# Patient Record
Sex: Male | Born: 1955 | Race: White | Hispanic: No
Health system: Midwestern US, Community
[De-identification: ages and names within clinical notes are randomized; demographics above are authoritative.]

## PROBLEM LIST (undated history)

## (undated) DIAGNOSIS — G4730 Sleep apnea, unspecified: Secondary | ICD-10-CM

## (undated) DIAGNOSIS — Z720 Tobacco use: Secondary | ICD-10-CM

## (undated) DIAGNOSIS — K219 Gastro-esophageal reflux disease without esophagitis: Secondary | ICD-10-CM

## (undated) DIAGNOSIS — Z923 Personal history of irradiation: Secondary | ICD-10-CM

## (undated) DIAGNOSIS — C61 Malignant neoplasm of prostate: Secondary | ICD-10-CM

## (undated) DIAGNOSIS — E079 Disorder of thyroid, unspecified: Secondary | ICD-10-CM

## (undated) DIAGNOSIS — I10 Essential (primary) hypertension: Secondary | ICD-10-CM

## (undated) DIAGNOSIS — M1990 Unspecified osteoarthritis, unspecified site: Secondary | ICD-10-CM

## (undated) DIAGNOSIS — E119 Type 2 diabetes mellitus without complications: Secondary | ICD-10-CM

## (undated) DIAGNOSIS — K8689 Other specified diseases of pancreas: Secondary | ICD-10-CM

## (undated) DIAGNOSIS — D136 Benign neoplasm of pancreas: Secondary | ICD-10-CM

## (undated) DIAGNOSIS — Z8546 Personal history of malignant neoplasm of prostate: Secondary | ICD-10-CM

## (undated) DIAGNOSIS — K862 Cyst of pancreas: Secondary | ICD-10-CM

## (undated) DIAGNOSIS — N201 Calculus of ureter: Secondary | ICD-10-CM

## (undated) DIAGNOSIS — N132 Hydronephrosis with renal and ureteral calculous obstruction: Secondary | ICD-10-CM

## (undated) DIAGNOSIS — C259 Malignant neoplasm of pancreas, unspecified: Principal | ICD-10-CM

## (undated) DIAGNOSIS — R935 Abnormal findings on diagnostic imaging of other abdominal regions, including retroperitoneum: Secondary | ICD-10-CM

## (undated) DIAGNOSIS — Z1211 Encounter for screening for malignant neoplasm of colon: Secondary | ICD-10-CM

## (undated) HISTORY — PX: CHOLECYSTECTOMY: SHX55

## (undated) HISTORY — PX: TONSILLECTOMY: SUR1361

## (undated) HISTORY — PX: KNEE ARTHROSCOPY: SUR90

## (undated) HISTORY — PX: HERNIA REPAIR: SHX51

---

## 1995-07-15 HISTORY — PX: SHOULDER SURGERY: SHX246

## 2016-09-08 DIAGNOSIS — G4733 Obstructive sleep apnea (adult) (pediatric): Secondary | ICD-10-CM | POA: Insufficient documentation

## 2016-09-08 DIAGNOSIS — E039 Hypothyroidism, unspecified: Secondary | ICD-10-CM | POA: Insufficient documentation

## 2016-09-08 DIAGNOSIS — Z6841 Body Mass Index (BMI) 40.0 and over, adult: Secondary | ICD-10-CM | POA: Insufficient documentation

## 2016-09-08 DIAGNOSIS — E119 Type 2 diabetes mellitus without complications: Secondary | ICD-10-CM | POA: Insufficient documentation

## 2016-09-08 DIAGNOSIS — Z720 Tobacco use: Secondary | ICD-10-CM | POA: Insufficient documentation

## 2016-09-08 DIAGNOSIS — I10 Essential (primary) hypertension: Secondary | ICD-10-CM | POA: Insufficient documentation

## 2016-12-19 ENCOUNTER — Encounter: Payer: Self-pay | Admitting: Radiation Oncology

## 2016-12-23 ENCOUNTER — Encounter: Payer: Self-pay | Admitting: Radiation Oncology

## 2016-12-24 ENCOUNTER — Ambulatory Visit
Admission: RE | Admit: 2016-12-24 | Discharge: 2016-12-24 | Disposition: A | Discharge status: Home / Self Care | Payer: No Typology Code available for payment source | Source: Ambulatory Visit | Attending: Radiation Oncology | Admitting: Radiation Oncology

## 2016-12-24 ENCOUNTER — Ambulatory Visit: Payer: No Typology Code available for payment source

## 2016-12-24 DIAGNOSIS — C61 Malignant neoplasm of prostate: Secondary | ICD-10-CM | POA: Diagnosis present

## 2016-12-30 ENCOUNTER — Encounter: Payer: Self-pay | Admitting: Radiation Oncology

## 2016-12-30 NOTE — Progress Notes (Addendum)
GU Location of Tumor / Histology: Prostatic adenocarcinoma  If Prostate Cancer, Gleason Score is (4 + 5) on 08/14/2016 and PSA is (48.30) on 07/29/2016  Evan Newton presented to Dr. John Giovanni at [facility] in [place] on January 16th signs/symptoms of elevated PSA from approximately 2-3 years ago when he lived in [place], [place].  Patient complains that back in 2015 he also had lower urinary tract symptoms.  Biopsies of prostate on 08/14/2016 revealed:        Past/Anticipated interventions by urology, if any: no  Past/Anticipated interventions by medical oncology, if any: no  Weight changes, if any: no  Bowel/Bladder complaints, if any: Weak stream, nocturia q 2 hours, urinary frequency 12 times a day, urinary urgency, occasional dysuria, nocturia X 4-5. Denies urinary leakage. Reports dysuria is improving (3 weeks s/p exrt).    Nausea/Vomiting, if any: yes each morning without vomiting. Reports nausea was worse while receiving radiation therapy.  Pain issues, if any:  Chronic Joint Pain; intermittently  SAFETY ISSUES:  Prior radiation? Yes [facility]  Pacemaker/ICD? No  Possible current pregnancy? No  Is the patient on methotrexate? No  Current Complaints / other details:  60 year history of Smoking.  Takes Flomax and Lupron (next injection due 01/06/2017).  Reports considerable fatigue and further decline per Dr. Lianne Moris notes at [facility].   Patient received XRT at [facility] and per records declined further assistance from [facility] with brachytherapy and decided to seek treatment from another institution for brachytherapy.  Resides in [place].

## 2016-12-31 ENCOUNTER — Encounter: Payer: Self-pay | Admitting: Radiation Oncology

## 2016-12-31 ENCOUNTER — Ambulatory Visit
Admission: RE | Admit: 2016-12-31 | Discharge: 2016-12-31 | Disposition: A | Discharge status: Home / Self Care | Payer: No Typology Code available for payment source | Source: Ambulatory Visit | Attending: Radiation Oncology | Admitting: Radiation Oncology

## 2016-12-31 VITALS — BP 133/90 | HR 84 | Resp 16 | Ht 67.0 in | Wt 316.4 lb

## 2016-12-31 DIAGNOSIS — C61 Malignant neoplasm of prostate: Secondary | ICD-10-CM

## 2016-12-31 HISTORY — DX: Essential (primary) hypertension: I10

## 2016-12-31 HISTORY — DX: Gastro-esophageal reflux disease without esophagitis: K21.9

## 2016-12-31 HISTORY — DX: Personal history of irradiation: Z92.3

## 2016-12-31 HISTORY — DX: Sleep apnea, unspecified: G47.30

## 2016-12-31 HISTORY — DX: Tobacco use: Z72.0

## 2016-12-31 HISTORY — DX: Malignant neoplasm of prostate: C61

## 2016-12-31 HISTORY — DX: Type 2 diabetes mellitus without complications: E11.9

## 2016-12-31 HISTORY — DX: Disorder of thyroid, unspecified: E07.9

## 2016-12-31 NOTE — Progress Notes (Signed)
See progress note under physician encounter. 

## 2016-12-31 NOTE — Progress Notes (Signed)
Radiation Oncology         [phone]) [phone] ________________________________  Initial outpatient Consultation  Name: Evan Newton MRN: [id number]  Date: 12/31/2016  DOB: 1956-07-07  EH:[id number], Evan Mule, MD  Crowder, Evan Newton,*   REFERRING PHYSICIAN: Stoney Newton,*  DIAGNOSIS: 61 y.o. 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 Malignant neoplasm of prostate ([place]) C61      HISTORY OF PRESENT ILLNESS: Evan Newton is a 61 y.o. male with a diagnosis of prostate cancer. He was noted to have an elevated PSA of 38.54 by his primary care physician in 06/2016.  Accordingly, he was referred for evaluation in urology by Dr. John Giovanni on 07/29/2016, digital rectal exam at that time revealed a right sided nodule.  Repeat PSA on 07/29/16 was further elevated at 48.30. The patient proceeded to transrectal ultrasound with 12 biopsies of the prostate on 08/14/2016.  The prostate volume measured 62 cc.  Out of 12 core biopsies, 3 were positive.  The maximum Gleason score was 4+5, and this was seen in the right apex, right mid, and right base.   CT A/P for disease staging was obtained on 09/05/16 and revealed numerous subcentimeter bilateral pulmonary nodules felt to be indeterminate as well as multiple subcentimeter sclerotic osseous lesions but no evidence of obvious metastatic disease. He underwent a bone scan on 09/05/2016 which showed a focus of uptake in the posterior skull concerning for sclerotic metastasis.  This was further evaluated with a CT head on 09/12/2016 which was negative for metastatic lesions.  An MRI of the prostate was performed on 09/27/2016 and showed evidence of extracapsular extension. The tumor was felt to contact, but not definitely invading, the base of the right seminal vesicle. There was an indeterminate foci in the right femoral head/neck which was questionable for metastatic disease.  He  began hormone depravation therapy with Lupron and Casodex on October 06, 2016. His next Lupron injection is due 01/08/17.  In addition to ADT, the patient elected to proceed with 5 weeks of EBRT followed by a brachytherapy boost for treatment of his high risk prostate cancer.  He initiated treatment at [facility] with Dr. Bridgett Newton in radiation oncology. He received a total of 45gy at 1.8gy/fraction for 25 fractions which he completed on 12/19/16. He was initially planned to undergo brachytherapy on 01/12/2017 but unfortunately, due to a negative experience throughout EBRT, he elected to cancel the procedure and seek care locally.  He presents to the clinic today to discuss prostate brachytherapy as a boost, having already completed 5 weeks of EBRT. He is interested in transferring all of his care here to [facility] and would therefore also like a referral to a local urologist. He is accompanied today by his sister.   PREVIOUS RADIATION THERAPY: Yes  Prior RT to the prostate with Dr. Bridgett Newton at [facility]:  45gy at 1.8gy/fraction for 25 fractions.  Started Nov 14, 2016 and completed December 19, 2016.  PAST MEDICAL HISTORY:  Past Medical History:  Diagnosis Date  . Diabetes ([place])    Type 2  . GERD (gastroesophageal reflux disease)   . History of external beam radiation therapy   . Hypertension   . Prostate cancer ([place])   . Sleep apnea    CPAP at night  . Thyroid disease   . Tobacco abuse       PAST SURGICAL HISTORY: Past Surgical History:  Procedure Laterality Date  .  CHOLECYSTECTOMY    . HERNIA REPAIR    . KNEE ARTHROPLASTY    . TONSILLECTOMY      FAMILY HISTORY: No family history on file.  SOCIAL HISTORY:  Social History   Social History  . Marital status: Divorced    Spouse name: N/A  . Number of children: N/A  . Years of education: N/A   Occupational History  . Not on file.   Social History Main Topics  . Smoking status: Never Smoker  . Smokeless tobacco: Never Used  . Alcohol use No  . Drug  use: No  . Sexual activity: Not Currently   Other Topics Concern  . Not on file   Social History Narrative  . No narrative on file    ALLERGIES: Patient has no known allergies.  MEDICATIONS:  Current Outpatient Prescriptions  Medication Sig Dispense Refill  . amLODipine (NORVASC) 5 MG tablet Take 5 mg by mouth daily.    Marland Kitchen atorvastatin (LIPITOR) 20 MG tablet Take 20 mg by mouth daily.    . bicalutamide (CASODEX) 50 MG tablet Take 50 mg by mouth daily.    . fluticasone (FLONASE) 50 MCG/ACT nasal spray Place into both nostrils daily.    Marland Kitchen glipiZIDE (GLUCOTROL) 10 MG tablet Take 10 mg by mouth daily before breakfast.    . levothyroxine (SYNTHROID, LEVOTHROID) 200 MCG tablet Take 200 mcg by mouth daily before breakfast.    . loratadine (CLARITIN) 10 MG tablet Take 10 mg by mouth daily.    . metFORMIN (GLUCOPHAGE) 500 MG tablet Take 1,000 mg by mouth 2 (two) times daily with a meal.    . tamsulosin (FLOMAX) 0.4 MG CAPS capsule Take 0.4 mg by mouth.    . valsartan (DIOVAN) 160 MG tablet Take 160 mg by mouth daily.    Marland Kitchen aspirin EC 81 MG tablet Take 81 mg by mouth daily.     No current facility-administered medications for this encounter.     REVIEW OF SYSTEMS:  On review of systems, the patient reports that he is doing well overall. He denies weight change. He reports fatigue, which is somewhat improved recently, and also has hot flashes, which he attributes to his hormone depravation therapy. He reports nausea and vomiting. He denies any new musculoskeletal or joint aches or pains, but reports chronic joint pain, which is intermittent in nature. The patient denies SOB, asthma or COPD.  He has sleep apnea and uses a CPAP at night. His IPSS was 26, indicating severe urinary symptoms.  These symptoms include weak stream, nocturia x 4-5, urinary frequency 12x per day, urinary urgency, and occasional dysuria. He denies gross hematuria, flank pain, fever or chills. He has mild-moderate ED. A  complete review of systems is obtained and is otherwise negative.    PHYSICAL EXAM:  Wt Readings from Last 3 Encounters:  12/31/16 (!) 316 lb 6.4 oz (143.5 kg)   Temp Readings from Last 3 Encounters:  No data found for Temp   BP Readings from Last 3 Encounters:  12/31/16 133/90   Pulse Readings from Last 3 Encounters:  12/31/16 84   Pain Assessment Pain Score:  (see progress note)/10  In general this is a well appearing, caucasian male in no acute distress. He is alert and oriented x4 and appropriate throughout the examination. HEENT reveals that the patient is normocephalic, atraumatic. Cardiovascular exam reveals a regular rate and rhythm, no clicks rubs or murmurs are auscultated. Chest is clear to auscultation bilaterally. Lymphatic assessment is performed and  does not reveal any adenopathy in the cervical, supraclavicular, axillary, or inguinal chains. Abdomen is relatively large, non-distended and non-tender to palpation with active bowel sounds in all quadrants. There is 2+ pitting edema in bilateral LEs but no deep calf pain, cyanosis or clubbing.   KPS = 90  LABORATORY DATA:  No results found for: WBC, HGB, HCT, MCV, PLT No results found for: NA, K, CL, CO2 No results found for: ALT, AST, GGT, ALKPHOS, BILITOT   RADIOGRAPHY: No results found.    IMPRESSION/PLAN: 1. 61 y.o. 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 Today, we and I reviewed the findings and workup thus far.  We discussed the natural history of prostate cancer.  We reviewed the the implications of T-stage, Gleason's Score, and PSA on decision-making and outcomes in prostate cancer.  We discussed radiation treatment in the management of high risk prostate cancer with regard to the logistics and delivery of prostate brachytherapy. He is currently on ADT which was started 10/06/16.  We would recommend 2 years of ADT in addition to his radiotherapy for  treatment of his high risk prostate cancer. The patient expressed interest in prostate brachytherapy as a boost, having previously completed 5 weeks of EBRT with Dr. Bridgett Newton at [facility].  The patient would like to proceed with prostate brachytherapy. He will be referred to [facility] for consultation to establish care with a local urologist for continuation of ADT and brachytherapy scheduling in the near future.  We enjoyed meeting with him today, and look forward to participating in the care of this very nice gentleman.    Nicholos Johns, PA-C    Tyler Pita, MD  [facility] Direct Dial: [phone]  Fax: [phone] [email or web address]  Skype  [email or web address]  This document serves as a record of services personally performed by Tyler Pita, MD and Freeman Caldron, PA-C. It was created on their behalf by Maryla Morrow, a trained medical scribe. The creation of this record is based on the scribe's personal observations and the provider's statements to them. This document has been checked and approved by the attending provider.

## 2017-01-01 ENCOUNTER — Telehealth: Payer: Self-pay | Admitting: Medical Oncology

## 2017-01-01 NOTE — Telephone Encounter (Signed)
Left a message to introduce myself as prostate nurse navigator and my role to Evan Newton. I was unable to meet him when he consulted with Dr. Tammi Klippel 12/31/16. He currently just completed 5 weeks of radiation at [facility] and is for brachytherapy boost. Dr. Johny Shears office is going to get him scheduled with [facility] to assist. I asked him to call me with questions or concerns.

## 2017-01-02 ENCOUNTER — Encounter: Payer: Self-pay | Admitting: *Deleted

## 2017-01-02 NOTE — Progress Notes (Signed)
[place] Psychosocial Distress Screening Clinical Social Work  Clinical Social Work was referred by distress screening protocol.  The patient scored a 9 on the Psychosocial Distress Thermometer which indicates severe distress. Clinical Social Worker reviewed chart and contacted pt via phone at home to assess for distress and other psychosocial needs. CSW introduced self, explained role of CSW/Pt and Family Support Team, support groups and other resources to assist. Pt reports he was on the way to a funeral and could not talk at the present time. Pt encouraged to reach out as needed for CSW assistance and support.    ONCBCN DISTRESS SCREENING 12/31/2016  Screening Type Initial Screening  Distress experienced in past week (1-10) 9  Practical problem type Insurance;Work/school;Food  Family Problem type Children  Emotional problem type Nervousness/Anxiety;Adjusting to illness;Feeling hopeless  Information Concerns Type Lack of info about diagnosis;Lack of info about treatment;Lack of info about maintaining fitness  Physical Problem type Pain;Nausea/vomiting;Sleep/insomnia;Getting around;Constipation/diarrhea;Changes in urination;Tingling hands/feet;Skin dry/itchy  Physician notified of physical symptoms Yes  Referral to clinical psychology No  Referral to clinical social work Yes  Referral to dietition No  Referral to financial advocate No  Referral to support programs No  Referral to palliative care No    Clinical Social Worker follow up needed: No.  If yes, follow up plan:  Loren Racer, Marlinda Mike, OSW-C Clinical Social Worker [place]  [facility] Phone: [phone] Fax: [phone]

## 2017-01-05 ENCOUNTER — Telehealth: Payer: Self-pay | Admitting: Medical Oncology

## 2017-01-05 ENCOUNTER — Telehealth: Payer: Self-pay | Admitting: *Deleted

## 2017-01-05 NOTE — Telephone Encounter (Signed)
Called patient to inform of consultation on 02-04-17- arrival time - 10:15 am with Dr. Hollice Espy @ [facility] [address] @ the [place] office to the left once in the building, ph. No. - [phone], lvm for a return call

## 2017-01-06 ENCOUNTER — Telehealth: Payer: Self-pay | Admitting: Medical Oncology

## 2017-01-06 NOTE — Telephone Encounter (Signed)
Spoke with Evan Newton to confirm he received voicemail left by Enid Derry for appointment with Dr. Norina Buzzard in [place] on 02/04/17. He states that he received this message but  Dr. Cherrie Gauze office was able to schedule him 01/07/17 but he is not sure of time. I will confirm time and call him back. He voiced understanding. I spoke with Dr. Cherrie Gauze office and his arrival is 2:15 pm for 2:30 pm appointment. He voiced understanding.

## 2017-01-06 NOTE — Telephone Encounter (Signed)
Evan Newton called to inquire about appointment with urologist for seed boost. He was consulted with Dr. Tammi Klippel 12/31/16 and has not heard back. I informed him that I will follow up and give him a call back. He voiced understanding.

## 2017-01-07 ENCOUNTER — Ambulatory Visit (INDEPENDENT_AMBULATORY_CARE_PROVIDER_SITE_OTHER): Payer: No Typology Code available for payment source | Admitting: Urology

## 2017-01-07 ENCOUNTER — Encounter: Payer: Self-pay | Admitting: Urology

## 2017-01-07 VITALS — BP 126/70 | HR 85 | Ht 67.0 in | Wt 315.0 lb

## 2017-01-07 DIAGNOSIS — R350 Frequency of micturition: Secondary | ICD-10-CM

## 2017-01-07 DIAGNOSIS — C61 Malignant neoplasm of prostate: Secondary | ICD-10-CM

## 2017-01-07 MED ORDER — LEUPROLIDE ACETATE (3 MONTH) 22.5 MG IM KIT
22.5000 mg | PACK | Freq: Once | INTRAMUSCULAR | Status: AC
  Administered 2017-01-07: 22.5 mg via INTRAMUSCULAR

## 2017-01-07 NOTE — Progress Notes (Signed)
01/07/2017 5:16 PM   Evan Newton [birth year] [id number]  Referring provider: Freeman Caldron, PA-C [place], [place] [address]  Chief Complaint  Patient presents with  . Prostate Cancer    New Patient    HPI: 61 year old male with high risk prostate cancer, stage TIIa who presents to establish care of [facility]  He was initially diagnosed by Dr. Bernardo Heater on 07/29/2016 With an abnormal rectal exam, PSA of 38.54.  Prostate biopsy revealed a volume of 62 cc with 3 of 12 cores positive with a maximum Gleason score 4+5 at the right apex, right mid-gland, right base.   Staging including CT abdomen and pelvis on 2/18 revealed numerous subcentimeter pulmonary nodules which are indeterminate. There is also several subsequent centimeter osseous sclerotic lesions which were negative on bone scan. He did have a posterior skull focus concerning for sclerotic metastasis but follow-up CT head was negative. Additional imaging includes an MRI of the prostate on 09/27/2016 which showed evidence of extracapsular extension, possible right seminal vesicle invasion and a partial lesion involving the right femoral head/neck not seen on other studies.  He elected external beam radiation with brachytherapy boost as well as total androgen therapy including Lupron and Casodex started 09/2016.  He tolerated 5 weeks of external beam radiation (45 gray completed 12/19/16), however, due to general dissatisfaction with his care, he elected to transfer his care to [facility] which is more convenient for him.  He does report that he had fairly significant rectal symptoms including loose stool during radiation which is improved dramatically. He does have some baseline urinary symptoms including nocturia 2-3 which is slightly worse following radiation but slowly improving. His leg is able to empty his bladder. No dysuria or gross hematuria. No UTIs.  He is having hot flashes from his androgen  deprivation therapy, otherwise has no complaints. He ran out of Casodex a few days ago and just refilled this prescription.   PMH: Past Medical History:  Diagnosis Date  . Diabetes ([place])    Type 2  . GERD (gastroesophageal reflux disease)   . History of external beam radiation therapy   . Hypertension   . Prostate cancer ([place])   . Sleep apnea    CPAP at night  . Thyroid disease   . Tobacco abuse     Surgical History: Past Surgical History:  Procedure Laterality Date  . CHOLECYSTECTOMY    . HERNIA REPAIR    . KNEE ARTHROPLASTY    . TONSILLECTOMY      Home Medications:  Allergies as of 01/07/2017   No Known Allergies     Medication List       Accurate as of 01/07/17  5:16 PM. Always use your most recent med list.          amLODipine 5 MG tablet Commonly known as:  NORVASC Take 5 mg by mouth daily.   aspirin EC 81 MG tablet Take 81 mg by mouth daily.   atorvastatin 20 MG tablet Commonly known as:  LIPITOR Take 20 mg by mouth daily.   bicalutamide 50 MG tablet Commonly known as:  CASODEX Take 50 mg by mouth daily.   cetirizine 10 MG tablet Commonly known as:  ZYRTEC Take 10 mg by mouth daily.   fluticasone 50 MCG/ACT nasal spray Commonly known as:  FLONASE Place into both nostrils daily.   glipiZIDE 10 MG tablet Commonly known as:  GLUCOTROL Take 10 mg by mouth daily before breakfast.   levothyroxine 200  MCG tablet Commonly known as:  SYNTHROID, LEVOTHROID Take 200 mcg by mouth daily before breakfast.   loratadine 10 MG tablet Commonly known as:  CLARITIN Take 10 mg by mouth daily.   metFORMIN 500 MG tablet Commonly known as:  GLUCOPHAGE Take 1,000 mg by mouth 2 (two) times daily with a meal.   tamsulosin 0.4 MG Caps capsule Commonly known as:  FLOMAX Take 0.4 mg by mouth.   valsartan 160 MG tablet Commonly known as:  DIOVAN Take 160 mg by mouth daily.       Allergies: No Known Allergies  Family History: Family History  Problem  Relation Age of Onset  . Kidney cancer Neg Hx   . Prostate cancer Neg Hx   . Bladder Cancer Neg Hx     Social History:  reports that he has never smoked. He has never used smokeless tobacco. He reports that he does not drink alcohol or use drugs.  ROS: UROLOGY Frequent Urination?: Yes Hard to postpone urination?: Yes Burning/pain with urination?: Yes Get up at night to urinate?: Yes Leakage of urine?: No Urine stream starts and stops?: Yes Trouble starting stream?: No Do you have to strain to urinate?: No Blood in urine?: No Urinary tract infection?: No Sexually transmitted disease?: No Injury to kidneys or bladder?: No Painful intercourse?: No Weak stream?: Yes Erection problems?: Yes Penile pain?: No  Gastrointestinal Nausea?: Yes Vomiting?: No Indigestion/heartburn?: No Diarrhea?: Yes Constipation?: No  Constitutional Fever: Yes Night sweats?: Yes Weight loss?: No Fatigue?: Yes  Skin Skin rash/lesions?: No Itching?: No  Eyes Blurred vision?: No Double vision?: No  Ears/Nose/Throat Sore throat?: Yes Sinus problems?: Yes  Hematologic/Lymphatic Swollen glands?: No Easy bruising?: No  Cardiovascular Leg swelling?: No Chest pain?: No  Respiratory Cough?: Yes Shortness of breath?: No  Endocrine Excessive thirst?: No  Musculoskeletal Back pain?: No Joint pain?: Yes  Neurological Headaches?: Yes Dizziness?: Yes  Psychologic Depression?: No Anxiety?: No  Physical Exam: BP 126/70   Pulse 85   Ht 5\' 7"  (1.702 m)   Wt (!) 315 lb (142.9 kg)   BMI 49.34 kg/m   Constitutional:  Alert and oriented, No acute distress. HEENT: [place] AT, moist mucus membranes.  Trachea midline, no masses. Cardiovascular: No clubbing, cyanosis, or edema. Respiratory: Normal respiratory effort, no increased work of breathing. GI: Abdomen is soft, nontender, nondistended, no abdominal masses.  Obese. GU: No CVA tenderness.  Skin: No rashes, bruises or suspicious  lesions. Neurologic: Grossly intact, no focal deficits, moving all 4 extremities. Psychiatric: Normal mood and affect.  Laboratory Data: Creatinine 0.8 on 2/18 Hemoglobin A1c 8.9 on 6/18 PSA 48.3 on 1/18  Urinalysis N/a  Pertinent Imaging: Image reports from [facility] including CT scan, bone scan, prostate MRI, head CT were all reviewed, however, unable to visualize images as this was done at outside facility.  Assessment & Plan:    1. Primary malignant neoplasm of prostate with high risk of recurrence due to Gleason score of 8 to 10 and PSA greater than 20 (HCC) High-risk prostate cancer currently on total androgen blockade with Lupron/Casodex Status post external beam radiation with plans for brachytherapy boost, desires to have additional radiotherapy at [facility] Case discussed today by telephone with Dr. Baruch Gouty was agreed to see the patient tomorrow and help facilitate this Lupron injection given today- side effects discussed Given that the patient has presumably Should've This Point in Time, No Overwhelming Evidence to Support Need for Casodex in Addition to ADT, Patient Will Continue His Current Prescription  and will consider whether or not to continue this in the future - leuprolide (LUPRON) injection 22.5 mg; Inject 22.5 mg into the muscle once.  2. Urinary frequency Continue Flomax Symptoms improving, may consider addition of anticholinergic and future if bother increases  Will arrange for brachytherapy boost  Hollice Espy, MD  [facility] [address], [place], [place] [address] [phone]  I spent 45 min with this patient of which greater than 50% was spent in counseling and coordination of care with the patient.

## 2017-01-07 NOTE — Progress Notes (Signed)
Lupron IM Injection   Due to Prostate Cancer patient is present today for a Lupron Injection.  Medication: Lupron 3 month Dose: 22.5 mg  Location: left upper outer buttocks Lot: [id number] Exp: 05/14/2019  Patient tolerated well, no complications were noted  Performed by: Fonnie Jarvis, CMA  Follow up: 3 months

## 2017-01-08 ENCOUNTER — Ambulatory Visit: Payer: No Typology Code available for payment source | Admitting: Radiation Oncology

## 2017-01-08 ENCOUNTER — Encounter: Payer: Self-pay | Admitting: Radiation Oncology

## 2017-01-08 ENCOUNTER — Other Ambulatory Visit: Payer: Self-pay | Admitting: *Deleted

## 2017-01-08 ENCOUNTER — Ambulatory Visit: Payer: No Typology Code available for payment source

## 2017-01-08 ENCOUNTER — Ambulatory Visit
Admission: RE | Admit: 2017-01-08 | Discharge: 2017-01-08 | Disposition: A | Discharge status: Home / Self Care | Payer: No Typology Code available for payment source | Source: Ambulatory Visit | Attending: Radiation Oncology | Admitting: Radiation Oncology

## 2017-01-08 VITALS — BP 113/67 | HR 76 | Temp 97.5°F | Resp 18 | Ht 67.0 in | Wt 313.3 lb

## 2017-01-08 DIAGNOSIS — Z7984 Long term (current) use of oral hypoglycemic drugs: Secondary | ICD-10-CM | POA: Diagnosis not present

## 2017-01-08 DIAGNOSIS — G4730 Sleep apnea, unspecified: Secondary | ICD-10-CM | POA: Insufficient documentation

## 2017-01-08 DIAGNOSIS — K219 Gastro-esophageal reflux disease without esophagitis: Secondary | ICD-10-CM | POA: Insufficient documentation

## 2017-01-08 DIAGNOSIS — J00 Acute nasopharyngitis [common cold]: Secondary | ICD-10-CM | POA: Diagnosis not present

## 2017-01-08 DIAGNOSIS — C61 Malignant neoplasm of prostate: Secondary | ICD-10-CM | POA: Diagnosis not present

## 2017-01-08 DIAGNOSIS — R197 Diarrhea, unspecified: Secondary | ICD-10-CM | POA: Diagnosis not present

## 2017-01-08 DIAGNOSIS — E119 Type 2 diabetes mellitus without complications: Secondary | ICD-10-CM | POA: Diagnosis not present

## 2017-01-08 DIAGNOSIS — Z79899 Other long term (current) drug therapy: Secondary | ICD-10-CM | POA: Diagnosis not present

## 2017-01-08 DIAGNOSIS — E079 Disorder of thyroid, unspecified: Secondary | ICD-10-CM | POA: Diagnosis not present

## 2017-01-08 DIAGNOSIS — I10 Essential (primary) hypertension: Secondary | ICD-10-CM | POA: Insufficient documentation

## 2017-01-08 DIAGNOSIS — F17210 Nicotine dependence, cigarettes, uncomplicated: Secondary | ICD-10-CM | POA: Diagnosis not present

## 2017-01-08 DIAGNOSIS — Z923 Personal history of irradiation: Secondary | ICD-10-CM | POA: Diagnosis not present

## 2017-01-08 MED ORDER — AZITHROMYCIN 250 MG PO TABS: ORAL_TABLET | ORAL | 0 refills | Status: DC

## 2017-01-08 NOTE — Consult Note (Signed)
NEW PATIENT EVALUATION  Name: Evan Newton  MRN: [id number]  Date:   01/08/2017     DOB: 1956-05-15   This 61 y.o. male patient presents to the clinic for initial evaluation of stage IIa adenocarcinoma the prostate presenting with a PSA of 38 and Gleason 9 (4+5) adenocarcinoma the prostate treated with androgen deprivation therapy as well as external beam radiation therapy at [facility] now for I-125 interstitial implant boost.  REFERRING PHYSICIAN: Stoney Bang,*  CHIEF COMPLAINT:  Chief Complaint  Patient presents with  . New Evaluation for Prostate    DIAGNOSIS: The encounter diagnosis was Malignant neoplasm of prostate ([place]).   PREVIOUS INVESTIGATIONS:  Pathology reports reviewed MRI of pelvis reports reviewed Clinical notes reviewed   HPI: Patient is a 61 year old male who presented with an elevated PSA in the 38 range. This prompt transrectal ultrasound-guided biopsy showing a Gleason 9 (4+5) in 3 of 12 cores. Staging CT scans as well as MRI of the pelvis showed no evidence of metastatic disease or pelvic adenopathy. The was concern for extracapsular extension as well as possible right seminal vesicle invasion. Other lesions seen and bone were thought to be non-representative metastatic disease. He was started on androgen deprivation therapy with Lupron and Casodex and underwent 4500 cGy to his pelvis and prostate. He has decided to transfer his care to our facility for completion of his treatments with a I-125 interstitial implant for boost. He is seen today and doing fairly well he has had significant diarrhea over the course of treatment although has not taken any Imodium at this time. He does follow some low residue diet prescription. He recently has a head cold. His prostate measures approximately 57 cc.  PLANNED TREATMENT REGIMEN: I-125 interstitial implant for boost.  PAST MEDICAL HISTORY:  has a past medical history of Diabetes ([place]); GERD (gastroesophageal reflux  disease); History of external beam radiation therapy; Hypertension; Prostate cancer ([place]); Sleep apnea; Thyroid disease; and Tobacco abuse.    PAST SURGICAL HISTORY:  Past Surgical History:  Procedure Laterality Date  . CHOLECYSTECTOMY    . HERNIA REPAIR    . KNEE ARTHROPLASTY    . SHOULDER SURGERY  1997   Rotator cuff  . TONSILLECTOMY      FAMILY HISTORY: family history is not on file.  SOCIAL HISTORY:  reports that he has been smoking.  He has a 40.00 pack-year smoking history. He has never used smokeless tobacco. He reports that he does not drink alcohol or use drugs.  ALLERGIES: Patient has no known allergies.  MEDICATIONS:  Current Outpatient Prescriptions  Medication Sig Dispense Refill  . amLODipine (NORVASC) 5 MG tablet Take 5 mg by mouth daily.    Marland Kitchen aspirin EC 81 MG tablet Take 81 mg by mouth daily.    Marland Kitchen atorvastatin (LIPITOR) 20 MG tablet Take 20 mg by mouth daily.    . bicalutamide (CASODEX) 50 MG tablet Take 50 mg by mouth daily.    . cetirizine (ZYRTEC) 10 MG tablet Take 10 mg by mouth daily.    . fluticasone (FLONASE) 50 MCG/ACT nasal spray Place into both nostrils daily.    Marland Kitchen glipiZIDE (GLUCOTROL) 10 MG tablet Take 10 mg by mouth daily before breakfast.    . levothyroxine (SYNTHROID, LEVOTHROID) 200 MCG tablet Take 200 mcg by mouth daily before breakfast.    . loratadine (CLARITIN) 10 MG tablet Take 10 mg by mouth daily.    . metFORMIN (GLUCOPHAGE) 500 MG tablet Take 1,000 mg by mouth 2 (two)  times daily with a meal.    . tamsulosin (FLOMAX) 0.4 MG CAPS capsule Take 0.4 mg by mouth.    . valsartan (DIOVAN) 160 MG tablet Take 160 mg by mouth daily.    Marland Kitchen azithromycin (ZITHROMAX Z-PAK) 250 MG tablet 5 Day Z-Pack, follow package directions 6 each 0   No current facility-administered medications for this encounter.     ECOG PERFORMANCE STATUS:  0 - Asymptomatic  REVIEW OF SYSTEMS:  Except for the diarrhea which seems to be improving Patient denies any weight loss,  fatigue, weakness, fever, chills or night sweats. Patient denies any loss of vision, blurred vision. Patient denies any ringing  of the ears or hearing loss. No irregular heartbeat. Patient denies heart murmur or history of fainting. Patient denies any chest pain or pain radiating to her upper extremities. Patient denies any shortness of breath, difficulty breathing at night, cough or hemoptysis. Patient denies any swelling in the lower legs. Patient denies any nausea vomiting, vomiting of blood, or coffee ground material in the vomitus. Patient denies any stomach pain. Patient states has had normal bowel movements no significant constipation or diarrhea. Patient denies any dysuria, hematuria or significant nocturia. Patient denies any problems walking, swelling in the joints or loss of balance. Patient denies any skin changes, loss of hair or loss of weight. Patient denies any excessive worrying or anxiety or significant depression. Patient denies any problems with insomnia. Patient denies excessive thirst, polyuria, polydipsia. Patient denies any swollen glands, patient denies easy bruising or easy bleeding. Patient denies any recent infections, allergies or URI. Patient "s visual fields have not changed significantly in recent time.    PHYSICAL EXAM: BP 113/67   Pulse 76   Temp 97.5 F (36.4 C) (Tympanic)   Resp 18   Ht 5\' 7"  (1.702 m)   Wt (!) 313 lb 4.4 oz (142.1 kg)   BMI 49.07 kg/m  Morbidly obese male in NAD. Well-developed well-nourished patient in NAD. HEENT reveals PERLA, EOMI, discs not visualized.  Oral cavity is clear. No oral mucosal lesions are identified. Neck is clear without evidence of cervical or supraclavicular I would like to take this opportunity to thank you for allowing me to participate in the care of your patient.Marland Kitchendenopathy. Lungs are clear to A&P. Cardiac examination is essentially unremarkable with regular rate and rhythm without murmur rub or thrill. Abdomen is benign  with no organomegaly or masses noted. Motor sensory and DTR levels are equal and symmetric in the upper and lower extremities. Cranial nerves II through XII are grossly intact. Proprioception is intact. No peripheral adenopathy or edema is identified. No motor or sensory levels are noted. Crude visual fields are within normal range.  LABORATORY DATA: Pathology reports reviewed    RADIOLOGY RESULTS: CT scans and MRI scan reports reviewed I have requested films from my own review   IMPRESSION: At least stage IIa Gleason 9 adenocarcinoma the prostate  PLAN: At this time like to go ahead with I-125 interstitial implant with the intent to deliver 115 gray is a boost to his prostate for overall completion of his treatment plan. Risks and benefits of implant including the risks of general anesthesia possible increased lower urinary tract symptoms possible diarrhea possible fatigue all were discussed in detail with the patient. Patient also was advised she will be radioactive and radiation safety precautions were reviewed. I have ordered a volume study to be done with urology followed by I-125 interstitial implant. Patient seems to comprehend my treatment plan  well.  I would like to take this opportunity to thank you for allowing me to participate in the care of your patient.Armstead Peaks., MD

## 2017-01-08 NOTE — Progress Notes (Signed)
Patient here as new evaluation regarding prostate cancer.  Patient was followed by [facility] and was unhappy with their service.  He recently saw Dr. Erlene Quan who referred him here.

## 2017-01-12 ENCOUNTER — Telehealth: Payer: Self-pay | Admitting: Radiology

## 2017-01-12 NOTE — Telephone Encounter (Signed)
LMOM. Need to discuss seed implant procedures.

## 2017-01-19 ENCOUNTER — Other Ambulatory Visit: Payer: Self-pay | Admitting: Radiology

## 2017-01-19 DIAGNOSIS — C61 Malignant neoplasm of prostate: Secondary | ICD-10-CM

## 2017-01-19 NOTE — Telephone Encounter (Signed)
Notified pt of prostate volume study scheduled with Dr Erlene Quan on 02/02/17 with arrival time to the [place] at 7:00 & to be npo after mn. Also notified pt of Brachytherapy scheduled 03/02/17, pre-admit testing appt on 02/20/17 @9 :00 & to call Friday prior to procedure for arrival time to SDS. Questions answered to pt's satisfaction & pt voices understanding.

## 2017-02-01 MED ORDER — CIPROFLOXACIN IN D5W 400 MG/200ML IV SOLN
400.0000 mg | INTRAVENOUS | Status: DC
Start: 2017-02-01 — End: 2017-04-05

## 2017-02-02 ENCOUNTER — Encounter: Payer: Self-pay | Admitting: Radiation Oncology

## 2017-02-02 ENCOUNTER — Ambulatory Visit
Admission: RE | Admit: 2017-02-02 | Discharge: 2017-02-02 | Disposition: A | Discharge status: Home / Self Care | Payer: No Typology Code available for payment source | Source: Ambulatory Visit | Attending: Radiation Oncology | Admitting: Radiation Oncology

## 2017-02-02 ENCOUNTER — Ambulatory Visit
Admission: RE | Admit: 2017-02-02 | Payer: No Typology Code available for payment source | Source: Ambulatory Visit | Admitting: Urology

## 2017-02-02 VITALS — BP 123/70 | HR 68 | Temp 98.1°F | Wt 314.9 lb

## 2017-02-02 DIAGNOSIS — Z7982 Long term (current) use of aspirin: Secondary | ICD-10-CM | POA: Insufficient documentation

## 2017-02-02 DIAGNOSIS — Z923 Personal history of irradiation: Secondary | ICD-10-CM | POA: Insufficient documentation

## 2017-02-02 DIAGNOSIS — E119 Type 2 diabetes mellitus without complications: Secondary | ICD-10-CM | POA: Diagnosis not present

## 2017-02-02 DIAGNOSIS — C61 Malignant neoplasm of prostate: Secondary | ICD-10-CM

## 2017-02-02 DIAGNOSIS — F17210 Nicotine dependence, cigarettes, uncomplicated: Secondary | ICD-10-CM | POA: Insufficient documentation

## 2017-02-02 DIAGNOSIS — Z9049 Acquired absence of other specified parts of digestive tract: Secondary | ICD-10-CM | POA: Diagnosis not present

## 2017-02-02 DIAGNOSIS — K219 Gastro-esophageal reflux disease without esophagitis: Secondary | ICD-10-CM | POA: Diagnosis not present

## 2017-02-02 DIAGNOSIS — G4730 Sleep apnea, unspecified: Secondary | ICD-10-CM | POA: Insufficient documentation

## 2017-02-02 DIAGNOSIS — Z7984 Long term (current) use of oral hypoglycemic drugs: Secondary | ICD-10-CM | POA: Diagnosis not present

## 2017-02-02 DIAGNOSIS — Z79899 Other long term (current) drug therapy: Secondary | ICD-10-CM | POA: Diagnosis not present

## 2017-02-02 DIAGNOSIS — E079 Disorder of thyroid, unspecified: Secondary | ICD-10-CM | POA: Diagnosis not present

## 2017-02-02 DIAGNOSIS — I10 Essential (primary) hypertension: Secondary | ICD-10-CM | POA: Insufficient documentation

## 2017-02-02 DIAGNOSIS — Z510 Encounter for antineoplastic radiation therapy: Secondary | ICD-10-CM | POA: Insufficient documentation

## 2017-02-02 SURGERY — ULTRASOUND, PROSTATE, FOR VOLUME DETERMINATION
Anesthesia: Choice

## 2017-02-02 NOTE — Progress Notes (Signed)
Radiation Oncology Volume study Note  Name: Evan Newton   Date:   01/09/2017 MRN:  [id number] DOB: 1956/02/13    This 61 y.o. male presents to the clinic today for Patient is a 61 year old male who presented with an elevated PSA in the 38 range. This prompt transrectal ultrasound-guided biopsy showing a Gleason 9 (4+5) in 3 of 12 cores. Staging CT scans as well as MRI of the pelvis showed no evidence of metastatic disease or pelvic adenopathy. The was concern for extracapsular extension as well as possible right seminal vesicle invasion. Other lesions seen and bone were thought to be non-representative metastatic disease. He was started on androgen deprivation therapy with Lupron and Casodex and underwent 4500 cGy to his pelvis and prostate. He has decided to transfer his care to our facility for completion of his treatments with a I-125 interstitial implant for boost. He is seen today and doing fairly well he has had significant diarrhea over the course of treatment although has not taken any Imodium at this time. He does follow some low residue diet prescription.  REFERRING PROVIDER: No ref. provider found  HPI: As above.  COMPLICATIONS OF TREATMENT: none  FOLLOW UP COMPLIANCE: keeps appointments   PHYSICAL EXAM:  There were no vitals taken for this visit. Well-developed well-nourished patient in NAD. HEENT reveals PERLA, EOMI, discs not visualized.  Oral cavity is clear. No oral mucosal lesions are identified. Neck is clear without evidence of cervical or supraclavicular adenopathy. Lungs are clear to A&P. Cardiac examination is essentially unremarkable with regular rate and rhythm without murmur rub or thrill. Abdomen is benign with no organomegaly or masses noted. Motor sensory and DTR levels are equal and symmetric in the upper and lower extremities. Cranial nerves II through XII are grossly intact. Proprioception is intact. No peripheral adenopathy or edema is identified. No motor or sensory  levels are noted. Crude visual fields are within normal range.  RADIOLOGY RESULTS: Ultrasound guidance used for prostate Study  PLAN: Patient was taken to the cystoscopy suite in the OR. Patient was placed in the low lithotomy position. Foley catheter was placed. Trans-rectal ultrasound probe was inserted into the rectum and prostate seminal vesicles were visualized as well as bladder base. stepping images were performed on a 5 mm increments. Images will be placed in BrachyVision treatment planning system to determine seed placement coordinates for eventual I-125 interstitial implant. Images will be reviewed with the physics and dosimetry staff for final quality approval. I personally was present for the volume study and assisted in delineation of contour volumes.  At the end of the procedure Foley catheter was removed, rectal ultrasound probe was removed. Patient tolerated his procedures extremely well with no side effects or complaints. Patient has given appointment for interstitial implant date. Consent was signed today as well as history and physical performed in preparation for his outpatient surgical implant.      Armstead Peaks., MD

## 2017-02-02 NOTE — H&P (Signed)
H&P  Name: Evan Newton  MRN: [id number]  Date:   02/02/2017     DOB: 08-16-1955   This 61 y.o. male patient presents to the clinic for history and physical prior to I-125 interstitial implant   REFERRING PHYSICIAN: Stoney Bang,*  CHIEF COMPLAINT:  Chief Complaint  Patient presents with  . Prostate Cancer    DIAGNOSIS: The encounter diagnosis was Malignant neoplasm of prostate ([place]).   PREVIOUS INVESTIGATIONS:  Clinical notes reviewed  HPI: Patient is a 61 year old male who presented with a PSA and 38 range underwent transrectal ultrasound-guided biopsy for Gleason 9 (4+5) in 312 cores. There was no evidence of metastatic disease or pelvic adenopathy on MRI scan patient underwent a DT therapy as well as 4500 cGy to his pelvis and prostate at [facility] then transferred his care here for I-125 interstitial boost. He is seen today for his volume study. Patient is doing well and is without complaint.  PLANNED TREATMENT REGIMEN: I-125 interstitial implant for boost  PAST MEDICAL HISTORY:  has a past medical history of Diabetes ([place]); GERD (gastroesophageal reflux disease); History of external beam radiation therapy; Hypertension; Prostate cancer ([place]); Sleep apnea; Thyroid disease; and Tobacco abuse.    PAST SURGICAL HISTORY:  Past Surgical History:  Procedure Laterality Date  . CHOLECYSTECTOMY    . HERNIA REPAIR    . KNEE ARTHROPLASTY    . SHOULDER SURGERY  1997   Rotator cuff  . TONSILLECTOMY      FAMILY HISTORY: family history is not on file.  SOCIAL HISTORY:  reports that he has been smoking.  He has a 40.00 pack-year smoking history. He has never used smokeless tobacco. He reports that he does not drink alcohol or use drugs.  ALLERGIES: Patient has no known allergies.  MEDICATIONS:  Current Outpatient Prescriptions  Medication Sig Dispense Refill  . amLODipine (NORVASC) 5 MG tablet Take 5 mg by mouth daily.    Marland Kitchen aspirin EC 81 MG tablet Take 81 mg by mouth daily.     Marland Kitchen atorvastatin (LIPITOR) 20 MG tablet Take 20 mg by mouth daily.    . bicalutamide (CASODEX) 50 MG tablet Take 50 mg by mouth daily.    . cetirizine (ZYRTEC) 10 MG tablet Take 10 mg by mouth daily.    . fluticasone (FLONASE) 50 MCG/ACT nasal spray Place 1 spray into both nostrils daily.     Marland Kitchen glipiZIDE (GLUCOTROL) 10 MG tablet Take 10 mg by mouth 2 (two) times daily before a meal.     . levothyroxine (SYNTHROID, LEVOTHROID) 200 MCG tablet Take 200 mcg by mouth daily before breakfast.    . metFORMIN (GLUCOPHAGE) 500 MG tablet Take 1,000 mg by mouth 2 (two) times daily with a meal.    . tamsulosin (FLOMAX) 0.4 MG CAPS capsule Take 0.4 mg by mouth daily.     . valsartan (DIOVAN) 160 MG tablet Take 160 mg by mouth daily.     No current facility-administered medications for this encounter.    Facility-Administered Medications Ordered in Other Encounters  Medication Dose Route Frequency Provider Last Rate Last Dose  . ciprofloxacin (CIPRO) IVPB 400 mg  400 mg Intravenous 60 min Pre-Op Hollice Espy, MD        ECOG PERFORMANCE STATUS:  0 - Asymptomatic  REVIEW OF SYSTEMS:  Patient denies any weight loss, fatigue, weakness, fever, chills or night sweats. Patient denies any loss of vision, blurred vision. Patient denies any ringing  of the ears or hearing loss. No irregular heartbeat.  Patient denies heart murmur or history of fainting. Patient denies any chest pain or pain radiating to her upper extremities. Patient denies any shortness of breath, difficulty breathing at night, cough or hemoptysis. Patient denies any swelling in the lower legs. Patient denies any nausea vomiting, vomiting of blood, or coffee ground material in the vomitus. Patient denies any stomach pain. Patient states has had normal bowel movements no significant constipation or diarrhea. Patient denies any dysuria, hematuria or significant nocturia. Patient denies any problems walking, swelling in the joints or loss of balance.  Patient denies any skin changes, loss of hair or loss of weight. Patient denies any excessive worrying or anxiety or significant depression. Patient denies any problems with insomnia. Patient denies excessive thirst, polyuria, polydipsia. Patient denies any swollen glands, patient denies easy bruising or easy bleeding. Patient denies any recent infections, allergies or URI. Patient "s visual fields have not changed significantly in recent time.    PHYSICAL EXAM: BP 123/70   Pulse 68   Temp 98.1 F (36.7 C)   Wt (!) 314 lb 14.8 oz (142.8 kg)   BMI 49.32 kg/m  Well-developed obese male in NAD.Well-developed well-nourished patient in NAD. HEENT reveals PERLA, EOMI, discs not visualized.  Oral cavity is clear. No oral mucosal lesions are identified. Neck is clear without evidence of cervical or supraclavicular adenopathy. Lungs are clear to A&P. Cardiac examination is essentially unremarkable with regular rate and rhythm without murmur rub or thrill. Abdomen is benign with no organomegaly or masses noted. Motor sensory and DTR levels are equal and symmetric in the upper and lower extremities. Cranial nerves II through XII are grossly intact. Proprioception is intact. No peripheral adenopathy or edema is identified. No motor or sensory levels are noted. Crude visual fields are within normal range.  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 We'll plan I-125 interstitial implant for boost to his prostatic volume. Risks and benefits of treatment including radiation safety precautions were reviewed. Side effects such as urinary frequency and urgency possible diarrhea fatigue all were discussed in  detail with the patient. He seems to comprehend my treatment plan well. Patient is orally set up for interstitial implant in about 1 month's time. He knows to call sooner with any concerns.  I would like to take this opportunity to thank you for allowing me to participate in the care of your patient.Armstead Peaks., MD

## 2017-02-04 ENCOUNTER — Ambulatory Visit: Payer: Self-pay | Admitting: Urology

## 2017-02-04 DIAGNOSIS — Z510 Encounter for antineoplastic radiation therapy: Secondary | ICD-10-CM | POA: Diagnosis not present

## 2017-02-17 ENCOUNTER — Other Ambulatory Visit (HOSPITAL_COMMUNITY): Payer: Self-pay | Admitting: Pharmacy Technician

## 2017-02-20 ENCOUNTER — Encounter
Admission: RE | Admit: 2017-02-20 | Discharge: 2017-02-20 | Disposition: A | Discharge status: Home / Self Care | Payer: No Typology Code available for payment source | Source: Ambulatory Visit | Attending: Urology | Admitting: Urology

## 2017-02-20 ENCOUNTER — Ambulatory Visit: Admission: RE | Admit: 2017-02-20 | Payer: No Typology Code available for payment source | Source: Ambulatory Visit

## 2017-02-20 ENCOUNTER — Ambulatory Visit
Admission: RE | Admit: 2017-02-20 | Discharge: 2017-02-20 | Disposition: A | Discharge status: Home / Self Care | Payer: No Typology Code available for payment source | Source: Ambulatory Visit | Attending: Urology | Admitting: Urology

## 2017-02-20 DIAGNOSIS — I10 Essential (primary) hypertension: Secondary | ICD-10-CM | POA: Diagnosis not present

## 2017-02-20 DIAGNOSIS — Z01810 Encounter for preprocedural cardiovascular examination: Secondary | ICD-10-CM | POA: Diagnosis present

## 2017-02-20 DIAGNOSIS — R918 Other nonspecific abnormal finding of lung field: Secondary | ICD-10-CM | POA: Diagnosis not present

## 2017-02-20 DIAGNOSIS — Z01812 Encounter for preprocedural laboratory examination: Secondary | ICD-10-CM | POA: Insufficient documentation

## 2017-02-20 HISTORY — DX: Unspecified osteoarthritis, unspecified site: M19.90

## 2017-02-20 LAB — COMPREHENSIVE METABOLIC PANEL
ALBUMIN: 3.9 g/dL (ref 3.5–5.0)
ALK PHOS: 114 U/L (ref 38–126)
ALT: 23 U/L (ref 17–63)
AST: 19 U/L (ref 15–41)
Anion gap: 9 (ref 5–15)
BILIRUBIN TOTAL: 0.5 mg/dL (ref 0.3–1.2)
BUN: 17 mg/dL (ref 6–20)
CALCIUM: 9.2 mg/dL (ref 8.9–10.3)
CO2: 24 mmol/L (ref 22–32)
CREATININE: 0.86 mg/dL (ref 0.61–1.24)
Chloride: 104 mmol/L (ref 101–111)
GFR calc Af Amer: >60 mL/min (ref >60)
GFR calc non Af Amer: >60 mL/min (ref >60)
Glucose, Bld: 222 mg/dL — ABNORMAL HIGH (ref 65–99)
Potassium: 3.9 mmol/L (ref 3.5–5.1)
SODIUM: 137 mmol/L (ref 135–145)
Total Protein: 7.4 g/dL (ref 6.5–8.1)

## 2017-02-20 LAB — PROTIME-INR
INR: 0.91
Prothrombin Time: 12.2 seconds (ref 11.4–15.2)

## 2017-02-20 LAB — CBC
HCT: 43 % (ref 40.0–52.0)
Hemoglobin: 14.7 g/dL (ref 13.0–18.0)
MCH: 31.9 pg (ref 26.0–34.0)
MCHC: 34.2 g/dL (ref 32.0–36.0)
MCV: 93.3 fL (ref 80.0–100.0)
PLATELETS: 270 10*3/uL (ref 150–440)
RBC: 4.61 MIL/uL (ref 4.40–5.90)
RDW: 14.8 % — AB (ref 11.5–14.5)
WBC: 9.2 10*3/uL (ref 3.8–10.6)

## 2017-02-20 LAB — DIFFERENTIAL
Basophils Absolute: 0.1 10*3/uL (ref 0–0.1)
Basophils Relative: 1 %
Eosinophils Absolute: 0.2 10*3/uL (ref 0–0.7)
Eosinophils Relative: 2 %
LYMPHS ABS: 1 10*3/uL (ref 1.0–3.6)
LYMPHS PCT: 11 %
MONO ABS: 0.7 10*3/uL (ref 0.2–1.0)
Monocytes Relative: 8 %
NEUTROS ABS: 7.2 10*3/uL — AB (ref 1.4–6.5)
Neutrophils Relative %: 78 %

## 2017-02-20 LAB — APTT: aPTT: 27 seconds (ref 24–36)

## 2017-02-20 NOTE — Patient Instructions (Signed)
  Your procedure is scheduled on: AUGUST 21. 2018 ([place]) Report to Same Day Surgery 2nd floor medical mall [facility] Entrance-take elevator on left to 2nd floor.  Check in with surgery information desk.) To find out your arrival time please call [phone] between 1PM - 3PM on AUGUST 20. 2018 [facility])  Remember: Instructions that are not followed completely may result in serious medical risk, up to and including death, or upon the discretion of your surgeon and anesthesiologist your surgery may need to be rescheduled.    _x___ 1. Do not eat food or drink liquids after midnight. No gum chewing or hard candies                         \   __x__ 2. No Alcohol for 24 hours before or after surgery.   __x__3. No Smoking for 24 prior to surgery.   ____  4. Bring all medications with you on the day of surgery if instructed.    __x__ 5. Notify your doctor if there is any change in your medical condition     (cold, fever, infections).     Do not wear jewelry, make-up, hairpins, clips or nail polish.  Do not wear lotions, powders, or perfumes. You may wear deodorant.  Do not shave 48 hours prior to surgery. Men may shave face and neck.  Do not bring valuables to the hospital.    [facility] is not responsible for any belongings or valuables.               Contacts, dentures or bridgework may not be worn into surgery.  Leave your suitcase in the car. After surgery it may be brought to your room.  For patients admitted to the hospital, discharge time is determined by your treatment team                       Patients discharged the day of surgery will not be allowed to drive home.  You will need someone to drive you home and stay with you the night of your procedure.    Please read over the following fact sheets that you were given:   [facility] Preparing for Surgery and or MRSA Information   TAKE THE FOLLOWING MEDICATIONS THE MORNING OF SURGERY WITH A SIP OF WATER :  1.  ATORVASTATIN  2. LEVOTHYROXINE  3.  4.  5.  6.  __X__Fleets enema or Magnesium Citrate as directed. ([place] )  ____ Use CHG Soap or sage wipes as directed on instruction sheet   ____ Use inhalers on the day of surgery and bring to hospital day of surgery  __x__ Stop Metformin 2 days prior to surgery.(STOP METFORMIN ON AUGUST 19 )  ____ Take 1/2 of usual insulin dose the night before surgery and none on the morning sugrery    _x___ Follow recommendations from Cardiologist, Pulmonologist or PCP regarding          stopping Aspirin, Coumadin, Plavix ,Eliquis, Effient, or Pradaxa, and Pletal. (PATIENT STATED ASPIRIN IS ON HOLD )  X____Stop Anti-inflammatories such as Advil, Aleve, Ibuprofen, Motrin, Naproxen, Naprosyn, Goodies powders or aspirin products. OK to take Tylenol   _x___ Stop supplements until after surgery.  But may continue Vitamin D, Vitamin B, and multivitamin        __x__ Bring C-Pap to the hospital.

## 2017-02-23 ENCOUNTER — Other Ambulatory Visit: Payer: Self-pay | Admitting: Radiology

## 2017-02-23 ENCOUNTER — Telehealth: Payer: Self-pay | Admitting: Urology

## 2017-02-23 DIAGNOSIS — C61 Malignant neoplasm of prostate: Secondary | ICD-10-CM

## 2017-02-23 DIAGNOSIS — R9389 Abnormal findings on diagnostic imaging of other specified body structures: Secondary | ICD-10-CM

## 2017-02-23 NOTE — Telephone Encounter (Signed)
Please let this patient know that his chest x-ray was slightly abnormal. The radiologists have recommended getting a chest CT. I will order this per their recommendations.  Hollice Espy, MD

## 2017-02-23 NOTE — Pre-Procedure Instructions (Signed)
CXR CALLED AND FAXED TO DR Erlene Quan AND LM FOR AMY

## 2017-02-23 NOTE — Telephone Encounter (Signed)
Pt states he had a chest CT done in [place] in June 2018. Advised pt to bring images on a CD to the office, but he may still need the CT done. Pt voices understanding. Please advise.

## 2017-02-23 NOTE — Telephone Encounter (Signed)
It looks like it's been since February. His last chest x-ray has ordered per anesthesia has some abnormal findings and follow-up was recommended. Given that in 6 months, anything is reasonable to proceed.  Unfortunately, I have yet to meet this patient. I'm helping Dr. Baruch Gouty out with brachytherapy but the chest x-ray got ordered under my name by anesethsia. I'm just doing my due diligence.  Hollice Espy, MD

## 2017-02-24 NOTE — Telephone Encounter (Signed)
Advised pt that, per Dr Erlene Quan, a current CT will need to be done because it has been 6 months since the last one. Questions answered. Pt voices understanding.

## 2017-02-26 ENCOUNTER — Encounter: Payer: Self-pay | Admitting: Radiology

## 2017-03-02 ENCOUNTER — Telehealth: Payer: Self-pay | Admitting: Radiology

## 2017-03-02 ENCOUNTER — Ambulatory Visit: Payer: No Typology Code available for payment source

## 2017-03-02 ENCOUNTER — Ambulatory Visit
Admission: RE | Admit: 2017-03-02 | Discharge: 2017-03-02 | Disposition: A | Discharge status: Home / Self Care | Payer: No Typology Code available for payment source | Source: Ambulatory Visit | Attending: Urology | Admitting: Urology

## 2017-03-02 DIAGNOSIS — I2510 Atherosclerotic heart disease of native coronary artery without angina pectoris: Secondary | ICD-10-CM | POA: Diagnosis not present

## 2017-03-02 DIAGNOSIS — S299XXA Unspecified injury of thorax, initial encounter: Secondary | ICD-10-CM | POA: Diagnosis not present

## 2017-03-02 DIAGNOSIS — R938 Abnormal findings on diagnostic imaging of other specified body structures: Secondary | ICD-10-CM | POA: Insufficient documentation

## 2017-03-02 DIAGNOSIS — E278 Other specified disorders of adrenal gland: Secondary | ICD-10-CM | POA: Diagnosis not present

## 2017-03-02 DIAGNOSIS — I700 Atherosclerosis of aorta: Secondary | ICD-10-CM | POA: Insufficient documentation

## 2017-03-02 DIAGNOSIS — R9389 Abnormal findings on diagnostic imaging of other specified body structures: Secondary | ICD-10-CM

## 2017-03-02 DIAGNOSIS — R911 Solitary pulmonary nodule: Secondary | ICD-10-CM

## 2017-03-02 DIAGNOSIS — J439 Emphysema, unspecified: Secondary | ICD-10-CM | POA: Insufficient documentation

## 2017-03-02 MED ORDER — IOPAMIDOL (ISOVUE-300) INJECTION 61%
75.0000 mL | Freq: Once | INTRAVENOUS | Status: AC | PRN
  Administered 2017-03-02: 75 mL via INTRAVENOUS

## 2017-03-02 MED ORDER — CIPROFLOXACIN IN D5W 400 MG/200ML IV SOLN
400.0000 mg | Freq: Once | INTRAVENOUS | Status: AC
  Administered 2017-03-03: 400 mg via INTRAVENOUS

## 2017-03-02 NOTE — Telephone Encounter (Signed)
Pt requests return call regarding CT Chest results. Pt may be reached at [phone].

## 2017-03-03 ENCOUNTER — Encounter: Payer: Self-pay | Admitting: Anesthesiology

## 2017-03-03 ENCOUNTER — Ambulatory Visit
Admission: RE | Admit: 2017-03-03 | Discharge: 2017-03-03 | Disposition: A | Discharge status: Home / Self Care | Payer: No Typology Code available for payment source | Source: Ambulatory Visit | Attending: Radiation Oncology | Admitting: Radiation Oncology

## 2017-03-03 ENCOUNTER — Encounter
Admission: RE | Disposition: A | Discharge status: Home / Self Care | Payer: Self-pay | Source: Ambulatory Visit | Attending: Urology

## 2017-03-03 ENCOUNTER — Ambulatory Visit
Admission: RE | Admit: 2017-03-03 | Discharge: 2017-03-03 | Disposition: A | Discharge status: Home / Self Care | Payer: No Typology Code available for payment source | Source: Ambulatory Visit | Attending: Urology | Admitting: Urology

## 2017-03-03 ENCOUNTER — Ambulatory Visit: Payer: No Typology Code available for payment source | Admitting: Anesthesiology

## 2017-03-03 DIAGNOSIS — E079 Disorder of thyroid, unspecified: Secondary | ICD-10-CM | POA: Insufficient documentation

## 2017-03-03 DIAGNOSIS — C61 Malignant neoplasm of prostate: Secondary | ICD-10-CM | POA: Insufficient documentation

## 2017-03-03 DIAGNOSIS — Z7982 Long term (current) use of aspirin: Secondary | ICD-10-CM | POA: Diagnosis not present

## 2017-03-03 DIAGNOSIS — I10 Essential (primary) hypertension: Secondary | ICD-10-CM | POA: Insufficient documentation

## 2017-03-03 DIAGNOSIS — K219 Gastro-esophageal reflux disease without esophagitis: Secondary | ICD-10-CM | POA: Insufficient documentation

## 2017-03-03 DIAGNOSIS — F17200 Nicotine dependence, unspecified, uncomplicated: Secondary | ICD-10-CM | POA: Diagnosis not present

## 2017-03-03 DIAGNOSIS — Z7984 Long term (current) use of oral hypoglycemic drugs: Secondary | ICD-10-CM | POA: Diagnosis not present

## 2017-03-03 DIAGNOSIS — G4730 Sleep apnea, unspecified: Secondary | ICD-10-CM | POA: Diagnosis not present

## 2017-03-03 DIAGNOSIS — Z79899 Other long term (current) drug therapy: Secondary | ICD-10-CM | POA: Diagnosis not present

## 2017-03-03 DIAGNOSIS — E119 Type 2 diabetes mellitus without complications: Secondary | ICD-10-CM | POA: Diagnosis not present

## 2017-03-03 HISTORY — PX: RADIOACTIVE SEED IMPLANT: SHX5150

## 2017-03-03 LAB — GLUCOSE, CAPILLARY
Glucose-Capillary: 274 mg/dL — ABNORMAL HIGH (ref 65–99)
Glucose-Capillary: 262 mg/dL — ABNORMAL HIGH (ref 65–99)
Glucose-Capillary: 244 mg/dL — ABNORMAL HIGH (ref 65–99)

## 2017-03-03 SURGERY — INSERTION, RADIATION SOURCE, PROSTATE
Anesthesia: General | Site: Prostate | Wound class: Clean Contaminated

## 2017-03-03 MED ORDER — INSULIN ASPART 100 UNIT/ML ~~LOC~~ SOLN
SUBCUTANEOUS | Status: AC
  Administered 2017-03-03: 4 [IU] via SUBCUTANEOUS
  Filled 2017-03-03: qty 1

## 2017-03-03 MED ORDER — CIPROFLOXACIN IN D5W 400 MG/200ML IV SOLN
INTRAVENOUS | Status: AC
  Filled 2017-03-03: qty 200

## 2017-03-03 MED ORDER — PROPOFOL 10 MG/ML IV BOLUS
INTRAVENOUS | Status: DC | PRN
  Administered 2017-03-03: 160 mg via INTRAVENOUS

## 2017-03-03 MED ORDER — FENTANYL CITRATE (PF) 100 MCG/2ML IJ SOLN: 25.0000 ug | INTRAMUSCULAR | Status: DC | PRN

## 2017-03-03 MED ORDER — ONDANSETRON HCL 4 MG/2ML IJ SOLN: 4.0000 mg | Freq: Once | INTRAMUSCULAR | Status: DC | PRN

## 2017-03-03 MED ORDER — FLEET ENEMA 7-19 GM/118ML RE ENEM: 1.0000 | ENEMA | Freq: Once | RECTAL | Status: DC

## 2017-03-03 MED ORDER — MIDAZOLAM HCL 2 MG/2ML IJ SOLN
INTRAMUSCULAR | Status: AC
  Filled 2017-03-03: qty 2

## 2017-03-03 MED ORDER — EPHEDRINE SULFATE 50 MG/ML IJ SOLN
INTRAMUSCULAR | Status: AC
  Filled 2017-03-03: qty 1

## 2017-03-03 MED ORDER — LIDOCAINE HCL (CARDIAC) 20 MG/ML IV SOLN
INTRAVENOUS | Status: DC | PRN
  Administered 2017-03-03: 40 mg via INTRAVENOUS

## 2017-03-03 MED ORDER — ROCURONIUM BROMIDE 50 MG/5ML IV SOLN
INTRAVENOUS | Status: AC
  Filled 2017-03-03: qty 1

## 2017-03-03 MED ORDER — CIPROFLOXACIN HCL 500 MG PO TABS: 500.0000 mg | ORAL_TABLET | Freq: Two times a day (BID) | ORAL | 0 refills | Status: DC

## 2017-03-03 MED ORDER — LIDOCAINE HCL (PF) 2 % IJ SOLN
INTRAMUSCULAR | Status: AC
  Filled 2017-03-03: qty 2

## 2017-03-03 MED ORDER — DOCUSATE SODIUM 100 MG PO CAPS: 100.0000 mg | ORAL_CAPSULE | Freq: Two times a day (BID) | ORAL | 0 refills | Status: DC

## 2017-03-03 MED ORDER — FAMOTIDINE 20 MG PO TABS
20.0000 mg | ORAL_TABLET | Freq: Once | ORAL | Status: AC
  Administered 2017-03-03: 20 mg via ORAL

## 2017-03-03 MED ORDER — SUCCINYLCHOLINE CHLORIDE 20 MG/ML IJ SOLN
INTRAMUSCULAR | Status: DC | PRN
  Administered 2017-03-03: 120 mg via INTRAVENOUS

## 2017-03-03 MED ORDER — SUGAMMADEX SODIUM 200 MG/2ML IV SOLN
INTRAVENOUS | Status: DC | PRN
  Administered 2017-03-03: 300 mg via INTRAVENOUS

## 2017-03-03 MED ORDER — FENTANYL CITRATE (PF) 100 MCG/2ML IJ SOLN
INTRAMUSCULAR | Status: DC | PRN
  Administered 2017-03-03: 25 ug via INTRAVENOUS
  Administered 2017-03-03: 50 ug via INTRAVENOUS
  Administered 2017-03-03: 25 ug via INTRAVENOUS

## 2017-03-03 MED ORDER — SODIUM CHLORIDE 0.9 % IV SOLN
INTRAVENOUS | Status: DC
  Administered 2017-03-03 (×2): via INTRAVENOUS

## 2017-03-03 MED ORDER — BACITRACIN 500 UNIT/GM EX OINT
TOPICAL_OINTMENT | CUTANEOUS | Status: DC | PRN
  Administered 2017-03-03: 1 via TOPICAL

## 2017-03-03 MED ORDER — HYDROCODONE-ACETAMINOPHEN 5-325 MG PO TABS: 1.0000 | ORAL_TABLET | Freq: Four times a day (QID) | ORAL | 0 refills | Status: DC | PRN

## 2017-03-03 MED ORDER — PROPOFOL 10 MG/ML IV BOLUS: INTRAVENOUS | Status: DC | PRN

## 2017-03-03 MED ORDER — FENTANYL CITRATE (PF) 100 MCG/2ML IJ SOLN
INTRAMUSCULAR | Status: AC
  Filled 2017-03-03: qty 2

## 2017-03-03 MED ORDER — BACITRACIN ZINC 500 UNIT/GM EX OINT
TOPICAL_OINTMENT | CUTANEOUS | Status: AC
  Filled 2017-03-03: qty 28.35

## 2017-03-03 MED ORDER — ONDANSETRON HCL 4 MG/2ML IJ SOLN
INTRAMUSCULAR | Status: DC | PRN
  Administered 2017-03-03: 4 mg via INTRAVENOUS

## 2017-03-03 MED ORDER — ROCURONIUM BROMIDE 100 MG/10ML IV SOLN
INTRAVENOUS | Status: DC | PRN
  Administered 2017-03-03: 10 mg via INTRAVENOUS
  Administered 2017-03-03: 5 mg via INTRAVENOUS
  Administered 2017-03-03: 45 mg via INTRAVENOUS

## 2017-03-03 MED ORDER — PROPOFOL 10 MG/ML IV BOLUS
INTRAVENOUS | Status: AC
  Filled 2017-03-03: qty 20

## 2017-03-03 MED ORDER — MIDAZOLAM HCL 2 MG/2ML IJ SOLN
INTRAMUSCULAR | Status: DC | PRN
  Administered 2017-03-03: 2 mg via INTRAVENOUS

## 2017-03-03 MED ORDER — SUGAMMADEX SODIUM 500 MG/5ML IV SOLN
INTRAVENOUS | Status: AC
  Filled 2017-03-03: qty 5

## 2017-03-03 MED ORDER — INSULIN ASPART 100 UNIT/ML ~~LOC~~ SOLN
4.0000 [IU] | Freq: Once | SUBCUTANEOUS | Status: AC
  Administered 2017-03-03: 4 [IU] via SUBCUTANEOUS

## 2017-03-03 MED ORDER — ONDANSETRON HCL 4 MG/2ML IJ SOLN
INTRAMUSCULAR | Status: AC
  Filled 2017-03-03: qty 2

## 2017-03-03 MED ORDER — FAMOTIDINE 20 MG PO TABS
ORAL_TABLET | ORAL | Status: AC
  Administered 2017-03-03: 20 mg via ORAL
  Filled 2017-03-03: qty 1

## 2017-03-03 SURGICAL SUPPLY — 24 items
BAG URO DRAIN 2000ML W/SPOUT (MISCELLANEOUS) ×2 IMPLANT
BLADE CLIPPER SURG (BLADE) ×2 IMPLANT
CATH FOL 2WAY LX 16X5 (CATHETERS) ×2 IMPLANT
DRAPE INCISE 23X17 IOBAN STRL (DRAPES) ×1
DRAPE INCISE IOBAN 23X17 STRL (DRAPES) ×1 IMPLANT
DRAPE SHEET LG 3/4 BI-LAMINATE (DRAPES) ×2 IMPLANT
DRAPE TABLE BACK 80X90 (DRAPES) ×2 IMPLANT
DRAPE UNDER BUTTOCK W/FLU (DRAPES) ×2 IMPLANT
DRSG TELFA 3X8 NADH (GAUZE/BANDAGES/DRESSINGS) ×2 IMPLANT
GLOVE BIO SURGEON STRL SZ 6.5 (GLOVE) ×2 IMPLANT
GLOVE BIO SURGEON STRL SZ7.5 (GLOVE) ×4 IMPLANT
GOWN STRL REUS W/ TWL LRG LVL3 (GOWN DISPOSABLE) ×2 IMPLANT
GOWN STRL REUS W/ TWL XL LVL3 (GOWN DISPOSABLE) ×1 IMPLANT
GOWN STRL REUS W/TWL LRG LVL3 (GOWN DISPOSABLE) ×2
GOWN STRL REUS W/TWL XL LVL3 (GOWN DISPOSABLE) ×1
IV NS 1000ML (IV SOLUTION) ×1
IV NS 1000ML BAXH (IV SOLUTION) ×1 IMPLANT
KIT RM TURNOVER CYSTO AR (KITS) ×2 IMPLANT
PACK CYSTO AR (MISCELLANEOUS) ×2 IMPLANT
SET CYSTO W/LG BORE CLAMP LF (SET/KITS/TRAYS/PACK) ×2 IMPLANT
SURGILUBE 2OZ TUBE FLIPTOP (MISCELLANEOUS) ×2 IMPLANT
SYRINGE 10CC LL (SYRINGE) ×2 IMPLANT
SYRINGE IRR TOOMEY STRL 70CC (SYRINGE) ×2 IMPLANT
WATER STERILE IRR 1000ML POUR (IV SOLUTION) ×2 IMPLANT

## 2017-03-03 NOTE — Anesthesia Preprocedure Evaluation (Signed)
Anesthesia Evaluation  Patient identified by MRN, date of birth, ID band Patient awake    Reviewed: Allergy & Precautions, NPO status , Patient's Chart, lab work & pertinent test results  Airway Mallampati: III  TM Distance: <3 FB     Dental  (+) Chipped   Pulmonary sleep apnea , Current Smoker,    Pulmonary exam normal        Cardiovascular hypertension, Pt. on medications Normal cardiovascular exam     Neuro/Psych negative neurological ROS  negative psych ROS   GI/Hepatic Neg liver ROS, GERD  Medicated,  Endo/Other  diabetes, Well Controlled, Type 2, Oral Hypoglycemic AgentsHypothyroidism Morbid obesity  Renal/GU negative Renal ROS  negative genitourinary   Musculoskeletal  (+) Arthritis , Osteoarthritis,    Abdominal Normal abdominal exam  (+)   Peds negative pediatric ROS (+)  Hematology negative hematology ROS (+)   Anesthesia Other Findings Past Medical History: No date: Arthritis No date: Diabetes ([place])     Comment:  Type 2 No date: GERD (gastroesophageal reflux disease) No date: History of external beam radiation therapy No date: Hypertension No date: Prostate cancer ([facility]) No date: Sleep apnea     Comment:  CPAP at night No date: Thyroid disease No date: Tobacco abuse  Reproductive/Obstetrics                             Anesthesia Physical Anesthesia Plan  ASA: III  Anesthesia Plan: General   Post-op Pain Management:    Induction: Intravenous, Rapid sequence and Cricoid pressure planned  PONV Risk Score and Plan:   Airway Management Planned: Oral ETT  Additional Equipment:   Intra-op Plan:   Post-operative Plan: Extubation in OR  Informed Consent: I have reviewed the patients History and Physical, chart, labs and discussed the procedure including the risks, benefits and alternatives for the proposed anesthesia with the patient or authorized representative who  has indicated his/her understanding and acceptance.   Dental advisory given  Plan Discussed with: CRNA and Surgeon  Anesthesia Plan Comments:         Anesthesia Quick Evaluation

## 2017-03-03 NOTE — Anesthesia Postprocedure Evaluation (Signed)
Anesthesia Post Note  Patient: Evan Newton  Procedure(s) Performed: Procedure(s) (LRB): RADIOACTIVE SEED IMPLANT/BRACHYTHERAPY IMPLANT (N/A)  Patient location during evaluation: PACU Anesthesia Type: General Level of consciousness: awake and alert and oriented Pain management: pain level controlled Vital Signs Assessment: post-procedure vital signs reviewed and stable Respiratory status: spontaneous breathing Cardiovascular status: blood pressure returned to baseline Anesthetic complications: no     Last Vitals:  Vitals:   03/03/17 1000 03/03/17 1004  BP:  128/70  Pulse: 66   Resp: 16   Temp: (!) 36.1 C   SpO2:  94%    Last Pain:  Vitals:   03/03/17 1000  TempSrc: Tympanic  PainSc: 2                  Divine Imber

## 2017-03-03 NOTE — Interval H&P Note (Signed)
History and Physical Interval Note:  03/03/2017 7:27 AM  Evan Newton  has presented today for surgery, with the diagnosis of PROSTATE CANCER  The various methods of treatment have been discussed with the patient and family. After consideration of risks, benefits and other options for treatment, the patient has consented to  Procedure(s): RADIOACTIVE SEED IMPLANT/BRACHYTHERAPY IMPLANT (N/A) as a surgical intervention .  The patient's history has been reviewed, patient examined, no change in status, stable for surgery.  I have reviewed the patient's chart and labs.  Questions were answered to the patient's satisfaction.    RRR CTAB  Hollice Espy

## 2017-03-03 NOTE — H&P (View-Only) (Signed)
H&P  Name: Evan Newton  MRN: [id number]  Date:   02/02/2017     DOB: [birth year]   This 61 y.o. male patient presents to the clinic for history and physical prior to I-125 interstitial implant   REFERRING PHYSICIAN: Stoney Bang,*  CHIEF COMPLAINT:  Chief Complaint  Patient presents with  . Prostate Cancer    DIAGNOSIS: The encounter diagnosis was Malignant neoplasm of prostate ([place]).   PREVIOUS INVESTIGATIONS:  Clinical notes reviewed  HPI: Patient is a 61 year old male who presented with a PSA and 38 range underwent transrectal ultrasound-guided biopsy for Gleason 9 (4+5) in 312 cores. There was no evidence of metastatic disease or pelvic adenopathy on MRI scan patient underwent a DT therapy as well as 4500 cGy to his pelvis and prostate at [facility] then transferred his care here for I-125 interstitial boost. He is seen today for his volume study. Patient is doing well and is without complaint.  PLANNED TREATMENT REGIMEN: I-125 interstitial implant for boost  PAST MEDICAL HISTORY:  has a past medical history of Diabetes ([place]); GERD (gastroesophageal reflux disease); History of external beam radiation therapy; Hypertension; Prostate cancer ([place]); Sleep apnea; Thyroid disease; and Tobacco abuse.    PAST SURGICAL HISTORY:  Past Surgical History:  Procedure Laterality Date  . CHOLECYSTECTOMY    . HERNIA REPAIR    . KNEE ARTHROPLASTY    . SHOULDER SURGERY  1997   Rotator cuff  . TONSILLECTOMY      FAMILY HISTORY: family history is not on file.  SOCIAL HISTORY:  reports that he has been smoking.  He has a 40.00 pack-year smoking history. He has never used smokeless tobacco. He reports that he does not drink alcohol or use drugs.  ALLERGIES: Patient has no known allergies.  MEDICATIONS:  Current Outpatient Prescriptions  Medication Sig Dispense Refill  . amLODipine (NORVASC) 5 MG tablet Take 5 mg by mouth daily.    Marland Kitchen aspirin EC 81 MG tablet Take 81 mg by mouth daily.     Marland Kitchen atorvastatin (LIPITOR) 20 MG tablet Take 20 mg by mouth daily.    . bicalutamide (CASODEX) 50 MG tablet Take 50 mg by mouth daily.    . cetirizine (ZYRTEC) 10 MG tablet Take 10 mg by mouth daily.    . fluticasone (FLONASE) 50 MCG/ACT nasal spray Place 1 spray into both nostrils daily.     Marland Kitchen glipiZIDE (GLUCOTROL) 10 MG tablet Take 10 mg by mouth 2 (two) times daily before a meal.     . levothyroxine (SYNTHROID, LEVOTHROID) 200 MCG tablet Take 200 mcg by mouth daily before breakfast.    . metFORMIN (GLUCOPHAGE) 500 MG tablet Take 1,000 mg by mouth 2 (two) times daily with a meal.    . tamsulosin (FLOMAX) 0.4 MG CAPS capsule Take 0.4 mg by mouth daily.     . valsartan (DIOVAN) 160 MG tablet Take 160 mg by mouth daily.     No current facility-administered medications for this encounter.    Facility-Administered Medications Ordered in Other Encounters  Medication Dose Route Frequency Provider Last Rate Last Dose  . ciprofloxacin (CIPRO) IVPB 400 mg  400 mg Intravenous 60 min Pre-Op Hollice Espy, MD        ECOG PERFORMANCE STATUS:  0 - Asymptomatic  REVIEW OF SYSTEMS:  Patient denies any weight loss, fatigue, weakness, fever, chills or night sweats. Patient denies any loss of vision, blurred vision. Patient denies any ringing  of the ears or hearing loss. No irregular heartbeat.  Patient denies heart murmur or history of fainting. Patient denies any chest pain or pain radiating to her upper extremities. Patient denies any shortness of breath, difficulty breathing at night, cough or hemoptysis. Patient denies any swelling in the lower legs. Patient denies any nausea vomiting, vomiting of blood, or coffee ground material in the vomitus. Patient denies any stomach pain. Patient states has had normal bowel movements no significant constipation or diarrhea. Patient denies any dysuria, hematuria or significant nocturia. Patient denies any problems walking, swelling in the joints or loss of balance.  Patient denies any skin changes, loss of hair or loss of weight. Patient denies any excessive worrying or anxiety or significant depression. Patient denies any problems with insomnia. Patient denies excessive thirst, polyuria, polydipsia. Patient denies any swollen glands, patient denies easy bruising or easy bleeding. Patient denies any recent infections, allergies or URI. Patient "s visual fields have not changed significantly in recent time.    PHYSICAL EXAM: BP 123/70   Pulse 68   Temp 98.1 F (36.7 C)   Wt (!) 314 lb 14.8 oz (142.8 kg)   BMI 49.32 kg/m  Well-developed obese male in NAD.Well-developed well-nourished patient in NAD. HEENT reveals PERLA, EOMI, discs not visualized.  Oral cavity is clear. No oral mucosal lesions are identified. Neck is clear without evidence of cervical or supraclavicular adenopathy. Lungs are clear to A&P. Cardiac examination is essentially unremarkable with regular rate and rhythm without murmur rub or thrill. Abdomen is benign with no organomegaly or masses noted. Motor sensory and DTR levels are equal and symmetric in the upper and lower extremities. Cranial nerves II through XII are grossly intact. Proprioception is intact. No peripheral adenopathy or edema is identified. No motor or sensory levels are noted. Crude visual fields are within normal range.  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 We'll plan I-125 interstitial implant for boost to his prostatic volume. Risks and benefits of treatment including radiation safety precautions were reviewed. Side effects such as urinary frequency and urgency possible diarrhea fatigue all were discussed in  detail with the patient. He seems to comprehend my treatment plan well. Patient is orally set up for interstitial implant in about 1 month's time. He knows to call sooner with any concerns.  I would like to take this opportunity to thank you for allowing me to participate in the care of your patient.Armstead Peaks., MD

## 2017-03-03 NOTE — Anesthesia Procedure Notes (Signed)
Procedure Name: Intubation Date/Time: 03/03/2017 7:50 AM Performed by: Allean Found Pre-anesthesia Checklist: Patient identified, Emergency Drugs available, Suction available, Patient being monitored and Timeout performed Patient Re-evaluated:Patient Re-evaluated prior to induction Oxygen Delivery Method: Circle system utilized Preoxygenation: Pre-oxygenation with 100% oxygen Induction Type: IV induction Ventilation: Mask ventilation with difficulty and Oral airway inserted - appropriate to patient size Laryngoscope Size: McGraph and 4 Grade View: Grade III Tube type: Oral Tube size: 7.5 mm Number of attempts: 1 Airway Equipment and Method: Stylet and Oral airway Placement Confirmation: ETT inserted through vocal cords under direct vision,  positive ETCO2 and breath sounds checked- equal and bilateral Secured at: 22 cm Tube secured with: Tape Dental Injury: Teeth and Oropharynx as per pre-operative assessment

## 2017-03-03 NOTE — Op Note (Signed)
03/03/17  Preoperative diagnosis: Adenocarcinoma of the prostate   Postoperative diagnosis: Same   Procedure: I-125 prostate seed implantation, cystoscopy  Surgeon: Hollice Espy M.D. ,   Radiation Oncology: Lavena Stanford, M.D.   Anesthesia: General  Drains: none  Complications: none  Indications: 61 yo M s/p EBRT for high risk prostate cancer who returns for brachytherapy boost seed implant.    Procedure: Patient was brought to operating suite and placement table in the supine position. At this time, a universal timeout protocol was performed, all team members were identified, Venodyne boots are placed, and he was administered IV Ancef in the preoperative period. He was placed in lithotomy position and prepped and draped in usual manner. Radiation oncology department placed a transrectal ultrasound probe anchoring stand/ grid and aligned with previous imaging from the volume study. Foley catheter was inserted without difficulty.  All needle passage was done with real-time transrectal ultrasound guidance in both the transverse and sagittal plains in order to achieve the desired preplanned position. A total of 18 needles were placed.  69 active seeds were implanted. The Foley catheter was removed and a rigid cystoscopy failed to show any seeds outside the prostate without evidence of trauma to the urethral, prostatic fossa, or bladder.  The bladder was drained.  A fluoroscopic image was then obtained showing excellent distrubution of the brachytherapy seeds.  Each seed was counted and counts were correct.    The patient was then repositioned in the supine position, reversed from anesthesia, and taken to the PACU in stable condition.

## 2017-03-03 NOTE — Op Note (Signed)
Radiation Oncology Operative Note  Name: Olga Seyler   Date:   01/09/2017 MRN:  [id number] DOB: Dec 04, 1955    This 61 y.o. male presents to the hospital today for I-125 interstitial implant for boost for adenocarcinoma the prostate.  REFERRING PROVIDER: No ref. provider found  HPI: Patient is a 61 year old male presented with a PSA in the 38 range transrectal ultrasound-guided biopsy was positive for Gleason 9 (4+5). He underwent external beam radiation therapy to 4500 cGy to his pelvis and prostate at [facility] and transfers care here for I-125 interstitial boost. Seen today for that procedure.  COMPLICATIONS OF TREATMENT: none  FOLLOW UP COMPLIANCE: keeps appointments   PHYSICAL EXAM:  BP 128/70   Pulse 66   Temp (!) 97 F (36.1 C) (Tympanic)   Resp 16   SpO2 94%  Well-developed well-nourished patient in NAD. HEENT reveals PERLA, EOMI, discs not visualized.  Oral cavity is clear. No oral mucosal lesions are identified. Neck is clear without evidence of cervical or supraclavicular adenopathy. Lungs are clear to A&P. Cardiac examination is essentially unremarkable with regular rate and rhythm without murmur rub or thrill. Abdomen is benign with no organomegaly or masses noted. Motor sensory and DTR levels are equal and symmetric in the upper and lower extremities. Cranial nerves II through XII are grossly intact. Proprioception is intact. No peripheral adenopathy or edema is identified. No motor or sensory levels are noted. Crude visual fields are within normal range.  RADIOLOGY RESULTS: Ultrasound guidance used for study as well as post treatment QA plain film  PLAN: patient was taken to the operating room and general anesthesia was administered. Legs were immobilized in stirrups and patient was positioned in the exact same proportions as original volume study. Patient was prepped and Foley catheter was placed. Ultrasound guidance identified the prostate and recreated the original set up as per  treatment planning volume study. 18 needles were placed under ultrasound guidance with PVCs delivered to the prostate volume. After completion of procedure cystoscopy was performed by urology and no evidence of seeds in the bladder were noted. Patient tolerated the procedure extremely well. Initial plain film as doublecheck identified 53 seeds in the prostate. Patient has followup appointment in one month for CT scan for quality assurance will be performed.     Armstead Peaks., MD

## 2017-03-03 NOTE — Telephone Encounter (Signed)
CT scan findings were discussed with the patient and his sister. He does have a previous CT of his chest from 08/2016 at [facility] which does also demonstrate nodules as well as a scan dating back to 2006.  I would like to refer him to pulmonology to continue to follow these appropriately. He is agreeable to this plan. Referral placed.    Please let him know that it would be helpful for him to come pick up the disks that he dropped off at our office and bring them to the pulmonology appointment.  Hollice Espy, MD

## 2017-03-03 NOTE — Transfer of Care (Signed)
Immediate Anesthesia Transfer of Care Note  Patient: Evan Newton  Procedure(s) Performed: Procedure(s): RADIOACTIVE SEED IMPLANT/BRACHYTHERAPY IMPLANT (N/A)  Patient Location: PACU  Anesthesia Type:General  Level of Consciousness: awake  Airway & Oxygen Therapy: Patient Spontanous Breathing and Patient connected to face mask oxygen  Post-op Assessment: Report given to RN and Post -op Vital signs reviewed and stable  Post vital signs: Reviewed and stable  Last Vitals:  Vitals:   03/03/17 0614 03/03/17 0855  BP: 139/75 (P) 126/72  Pulse: 67   Resp: 20 (!) (P) 22  Temp: (!) 36.2 C (!) (P) 36.3 C  SpO2: 97%     Last Pain:  Vitals:   03/03/17 0614  TempSrc: Oral         Complications: No apparent anesthesia complications

## 2017-03-03 NOTE — Anesthesia Post-op Follow-up Note (Signed)
Anesthesia QCDR form completed.        

## 2017-03-03 NOTE — Discharge Instructions (Signed)
Brachytherapy for Prostate Cancer, Care After Refer to this sheet in the next few weeks. These instructions provide you with information on caring for yourself after your procedure. Your health care provider may also give you more specific instructions. Your treatment has been planned according to current medical practices, but problems sometimes occur. Call your health care provider if you have any problems or questions after your procedure. What can I expect after the procedure? The area behind the scrotum will probably be tender and bruised. For a short period of time you may have:  Difficulty passing urine. You may need a catheter for a few days to a month.  Blood in the urine or semen.  A feeling of constipation because of prostate swelling.  Frequent feeling of an urgent need to urinate.  For a long period of time you may have:  Inflammation of the rectum. This happens in about 2% of people who have the procedure.  Erection problems. These vary with age and occur in about 15-40% of men.  Difficulty urinating. This is caused by scarring in the urethra.  Diarrhea.  Follow these instructions at home:  Take medicines only as directed by your health care provider.  You will probably have a catheter in your bladder for several days. You will have blood in the urine bag and should drink a lot of fluids to keep it a light red color.  Keep all follow-up visits as directed by your health care provider. If you have a catheter, it will be removed during one of these visits.  Try not to sit directly on the area behind the scrotum. A soft cushion can decrease the discomfort. Ice packs may also be helpful for the discomfort. Do not put ice directly on the skin.  Shower and wash the area behind the scrotum gently. Do not sit in a tub.  If you have had the brachytherapy that uses the seeds, limit your close contact with children and pregnant women for 2 months because of the  radiation still in the prostate. After that period of time, the levels drop off quickly. Get help right away if:  You have a fever.  You have chills.  You have shortness of breath.  You have chest pain.  You have thick blood, like tomato juice, in the urine bag.  Your catheter is blocked so urine cannot get into the bag. Your bladder area or lower abdomen may be swollen.  There is excessive bleeding from your rectum. It is normal to have a little blood mixed with your stool.  There is severe discomfort in the treated area that does not go away with pain medicine.  You have abdominal discomfort.  You have severe nausea or vomiting.  You develop any new or unusual symptoms. This information is not intended to replace advice given to you by your health care provider. Make sure you discuss any questions you have with your health care provider. Document Released: 08/02/2010 Document Revised: 12/12/2015 Document Reviewed: 12/21/2012 Elsevier Interactive Patient Education  2017 [employer]   1) The drugs that you were given will stay in your system until tomorrow so for the next 24 hours you should not:  A) Drive an automobile B) Make any legal decisions C) Drink any alcoholic beverage   2) You may resume regular meals tomorrow.  Today it is better to start with liquids and gradually work up to solid foods.  You  may eat anything you prefer, but it is better to start with liquids, then soup and crackers, and gradually work up to solid foods.   3) Please notify your doctor immediately if you have any unusual bleeding, trouble breathing, redness and pain at the surgery site, drainage, fever, or pain not relieved by medication.   4) Additional Instructions:DRINK  DRINK  DRINK  DRINK  DRINK   .  Follow all of Dr. Cherrie Gauze discharge information sheet.  Urine may be bloody for a few days.  Keep track of how much you drink as it should  be at least 8 ounces an hour.   Please contact your physician with any problems or Same Day Surgery at [phone], Monday through Friday 6 am to 4 pm, or [place] at [facility] number at [phone].

## 2017-03-04 ENCOUNTER — Encounter: Payer: Self-pay | Admitting: Urology

## 2017-03-04 DIAGNOSIS — Z510 Encounter for antineoplastic radiation therapy: Secondary | ICD-10-CM | POA: Diagnosis not present

## 2017-03-05 NOTE — Telephone Encounter (Signed)
LMOM that disks will be left at front desk for pt to take to the pulmonology appointment.

## 2017-03-17 NOTE — Progress Notes (Signed)
[facility] Consultation      Assessment and Plan:  Lung nodules.  -Multiple bilateral lung nodules, largest is 9 mm, will continue to monitor. He has a long history of nodules, going back several years, this could be contiguous with those nodules. -Repeat CT chest in 6 months.  Obstructive sleep apnea.  -Obstructive sleep apnea, appears severe, with morbid obesity. Patient is really on his own machine, on unknown settings. Sister notes continued apneic episodes while on CPAP. -We'll send for a split-night study  Diabetes, essential hypertension, Gerd.  - sleep apnea can contribute to above conditions, therefore, it is important to treat it if present.   Nicotine abuse. -Discussed importance of cessation, greater than 3 minutes.  Morbid obesity. -BMI equals 50. Likely contributing 2 sleep apnea.  Date: 03/19/2017  MRN# [id number] Evan Newton 59/18/1957   Evan Newton is a 61 y.o. old male seen in consultation for chief complaint of:    Chief Complaint  Patient presents with  . Advice Only    Referred by Hollice Espy eval abnormal CT scan  . lung nodules    Cough with green to clear mucus kind of thick/ patient denies chest tightness, wheezing.    HPI:   61 y/o with PMH of Type 2 DM, HTN, HLD, obesity, OSA on CPAP, hypothyroidism, prostate CA, tobacco abuse, allergies.  He notes that about 20 lbs years ago he had nodules in his lungs, he then underwent another ct about 3 months later, then no further scans were needed.  In February of 2018 he had been diagnosed with prostate ca (diagnosed of 06/2016), then he had a Ct chest which showed nodules.   He notes that his breathing is ok, he is currently smoking a ppd. He is considering quitting in the next 3 months, he quit for 8 months in the past with ecigs.  He has never been diagnosed with respiratory problems. He has been  Diagnosed with OSA, and wears a CPAP since the 90's. His machine is from 2012. He does  not know the settings,he bought a machine and he notes that "it came with settings". His sister is present today and notes that he has been sitting up and gasping for air with the CPAP. He thinks that he gained about 110 or 120 pounds since the original study.   He has no unexplained weight loss.    Images personally reviewed; CT chest 03/02/2017; minimal mediastinal lymphadenopathy. There are multiple bilateral nodules, mostly peripheral or pleural based, largest of which is 9 mm.  Outside reports: CT Chest, abdomen pelvis to 09/05/2016: Emphysema, bilateral subpleural nodules left upper lobe 0.4 cm pulmonary nodule subcentimeter nodules in the liver. CT head. 09/12/16; no acute findings   PMHX:   Past Medical History:  Diagnosis Date  . Arthritis   . Diabetes ([place])    Type 2  . GERD (gastroesophageal reflux disease)   . History of external beam radiation therapy   . Hypertension   . Prostate cancer ([place])   . Sleep apnea    CPAP at night  . Thyroid disease   . Tobacco abuse    Surgical Hx:  Past Surgical History:  Procedure Laterality Date  . CHOLECYSTECTOMY    . HERNIA REPAIR    . KNEE ARTHROSCOPY Right   . RADIOACTIVE SEED IMPLANT N/A 03/03/2017   Procedure: RADIOACTIVE SEED IMPLANT/BRACHYTHERAPY IMPLANT;  Surgeon: Hollice Espy, MD;  Location: [facility] ORS;  Service: Urology;  Laterality: N/A;  . SHOULDER SURGERY  Left 1997   Rotator cuff  . TONSILLECTOMY     Family Hx:  Family History  Problem Relation Age of Onset  . Hypertension Mother   . Diabetes Father   . Hypertension Father   . Kidney cancer Neg Hx   . Prostate cancer Neg Hx   . Bladder Cancer Neg Hx    Social Hx:   Social History  Substance Use Topics  . Smoking status: Current Every Day Smoker    Packs/day: 1.00    Years: 40.00    Types: Cigarettes  . Smokeless tobacco: Never Used  . Alcohol use No   Medication:    Current Outpatient Prescriptions:  .  acetaminophen (TYLENOL) 500 MG tablet, Take  1,000 mg by mouth every 6 (six) hours as needed for mild pain or headache., Disp: , Rfl:  .  amLODipine (NORVASC) 5 MG tablet, Take 5 mg by mouth every evening. , Disp: , Rfl:  .  aspirin EC 81 MG tablet, Take 81 mg by mouth daily., Disp: , Rfl:  .  atorvastatin (LIPITOR) 20 MG tablet, Take 20 mg by mouth daily., Disp: , Rfl:  .  bicalutamide (CASODEX) 50 MG tablet, Take 50 mg by mouth daily., Disp: , Rfl:  .  cetirizine (ZYRTEC) 10 MG tablet, Take 10 mg by mouth daily., Disp: , Rfl:  .  ciprofloxacin (CIPRO) 500 MG tablet, Take 1 tablet (500 mg total) by mouth 2 (two) times daily., Disp: 6 tablet, Rfl: 0 .  docusate sodium (COLACE) 100 MG capsule, Take 1 capsule (100 mg total) by mouth 2 (two) times daily., Disp: 60 capsule, Rfl: 0 .  fluticasone (FLONASE) 50 MCG/ACT nasal spray, Place 1 spray into both nostrils daily. , Disp: , Rfl:  .  glipiZIDE (GLUCOTROL) 10 MG tablet, Take 10 mg by mouth 2 (two) times daily before a meal. , Disp: , Rfl:  .  HYDROcodone-acetaminophen (NORCO/VICODIN) 5-325 MG tablet, Take 1-2 tablets by mouth every 6 (six) hours as needed for moderate pain., Disp: 6 tablet, Rfl: 0 .  levothyroxine (SYNTHROID, LEVOTHROID) 200 MCG tablet, Take 200 mcg by mouth daily before breakfast., Disp: , Rfl:  .  losartan (COZAAR) 50 MG tablet, Take 50 mg by mouth every evening. , Disp: , Rfl:  .  metFORMIN (GLUCOPHAGE) 500 MG tablet, Take 1,000 mg by mouth 2 (two) times daily with a meal., Disp: , Rfl:  .  tamsulosin (FLOMAX) 0.4 MG CAPS capsule, Take 0.4 mg by mouth daily. , Disp: , Rfl:  No current facility-administered medications for this visit.   Facility-Administered Medications Ordered in Other Visits:  .  ciprofloxacin (CIPRO) IVPB 400 mg, 400 mg, Intravenous, 60 min Pre-Op, Hollice Espy, MD   Allergies:  Patient has no known allergies.  Review of Systems: Gen:  Denies  fever, sweats, chills HEENT: Denies blurred vision, double vision. bleeds, sore throat Cvc:  No  dizziness, chest pain. Resp:   Denies cough or sputum production, shortness of breath Gi: Denies swallowing difficulty, stomach pain. Gu:  Denies bladder incontinence, burning urine Ext:   No Joint pain, stiffness. Skin: No skin rash,  hives  Endoc:  No polyuria, polydipsia. Psych: No depression, insomnia. Other:  All other systems were reviewed with the patient and were negative other that what is mentioned in the HPI.   Physical Examination:   VS: BP (!) 187/86 (BP Location: Right Arm, Cuff Size: Large)   Pulse 81   Resp 16   Ht 5\' 7"  (1.702 m)  Wt (!) 317 lb (143.8 kg)   SpO2 95%   BMI 49.65 kg/m   General Appearance: No distress . Appears fatigued, obese.  Neuro:without focal findings,  speech normal,  HEENT: PERRLA, EOM intact.   Pulmonary: normal breath sounds, No wheezing.  CardiovascularNormal S1,S2.  No m/r/g.   Abdomen: Benign, Soft, non-tender. Renal:  No costovertebral tenderness  GU:  No performed at this time. Endoc: No evident thyromegaly, no signs of acromegaly. Skin:   warm, no rashes, no ecchymosis  Extremities: normal, no cyanosis, clubbing.  Other findings:    LABORATORY PANEL:   CBC No results for input(s): WBC, HGB, HCT, PLT in the last 168 hours. ------------------------------------------------------------------------------------------------------------------  Chemistries  No results for input(s): NA, K, CL, CO2, GLUCOSE, BUN, CREATININE, CALCIUM, MG, AST, ALT, ALKPHOS, BILITOT in the last 168 hours.  Invalid input(s): GFRCGP ------------------------------------------------------------------------------------------------------------------  Cardiac Enzymes No results for input(s): TROPONINI in the last 168 hours. ------------------------------------------------------------  RADIOLOGY:  No results found.     Thank  you for the consultation and for allowing [facility] to assist in the care of your patient. Our  recommendations are noted above.  Please contact us if we can be of further service.   Marda Stalker, MD.  Board Certified in Internal Medicine, Pulmonary Medicine, [place], and Sleep Medicine.   Pulmonary and Critical Care Office Number: [phone]  Patricia Pesa, M.D.  Merton Border, M.D  03/19/2017

## 2017-03-19 ENCOUNTER — Ambulatory Visit (INDEPENDENT_AMBULATORY_CARE_PROVIDER_SITE_OTHER): Payer: No Typology Code available for payment source | Admitting: Internal Medicine

## 2017-03-19 ENCOUNTER — Encounter: Payer: Self-pay | Admitting: Internal Medicine

## 2017-03-19 VITALS — BP 187/86 | HR 81 | Resp 16 | Ht 67.0 in | Wt 317.0 lb

## 2017-03-19 DIAGNOSIS — Z720 Tobacco use: Secondary | ICD-10-CM

## 2017-03-19 DIAGNOSIS — R918 Other nonspecific abnormal finding of lung field: Secondary | ICD-10-CM

## 2017-03-19 DIAGNOSIS — F17210 Nicotine dependence, cigarettes, uncomplicated: Secondary | ICD-10-CM | POA: Diagnosis not present

## 2017-03-19 DIAGNOSIS — G4730 Sleep apnea, unspecified: Secondary | ICD-10-CM | POA: Diagnosis not present

## 2017-03-19 NOTE — Patient Instructions (Signed)
--  Will send for a sleep study.  --These nodules are low risk, will send for a repeat CT chest in 6 to 7 months.

## 2017-04-03 ENCOUNTER — Ambulatory Visit (INDEPENDENT_AMBULATORY_CARE_PROVIDER_SITE_OTHER): Payer: No Typology Code available for payment source | Admitting: Urology

## 2017-04-03 ENCOUNTER — Encounter: Payer: Self-pay | Admitting: Urology

## 2017-04-03 VITALS — BP 120/75 | HR 74 | Ht 67.0 in | Wt 315.0 lb

## 2017-04-03 DIAGNOSIS — R911 Solitary pulmonary nodule: Secondary | ICD-10-CM

## 2017-04-03 DIAGNOSIS — R350 Frequency of micturition: Secondary | ICD-10-CM

## 2017-04-03 DIAGNOSIS — N492 Inflammatory disorders of scrotum: Secondary | ICD-10-CM

## 2017-04-03 DIAGNOSIS — C61 Malignant neoplasm of prostate: Secondary | ICD-10-CM

## 2017-04-03 LAB — URINALYSIS, COMPLETE
Bilirubin, UA: NEGATIVE
Glucose, UA: NEGATIVE
KETONES UA: NEGATIVE
LEUKOCYTES UA: NEGATIVE
NITRITE UA: NEGATIVE
Protein, UA: NEGATIVE
RBC UA: NEGATIVE
SPEC GRAV UA: 1.02 (ref 1.005–1.030)
UUROB: 0.2 mg/dL (ref 0.2–1.0)
pH, UA: 5.5 (ref 5.0–7.5)

## 2017-04-03 LAB — BLADDER SCAN AMB NON-IMAGING: SCAN RESULT: 16

## 2017-04-03 MED ORDER — OXYBUTYNIN CHLORIDE 5 MG PO TABS
5.0000 mg | ORAL_TABLET | Freq: Three times a day (TID) | ORAL | 0 refills | Status: DC | PRN
Start: 2017-04-03 — End: 2017-04-07

## 2017-04-03 MED ORDER — CEPHALEXIN 500 MG PO CAPS: 500.0000 mg | ORAL_CAPSULE | Freq: Four times a day (QID) | ORAL | 0 refills | Status: DC

## 2017-04-03 NOTE — Progress Notes (Signed)
04/03/2017 10:57 AM   Evan Newton Aug 07, 1955 [id number]  Referring provider: Stoney Bang, MD [address] [place], [place] [address]  Chief Complaint  Patient presents with  . Prostate Cancer    HPI: 61 year old male with high risk prostate cancer, stage TIIa dx 2018 s/p EBRT/ brachytherapy boost currently on ADT.  He returns today for ongoing ADT as well as to discuss his urinary symptoms after recent brachytherapy seed placement.  He was initially diagnosed by Dr. Bernardo Heater on 07/29/2016 With an abnormal rectal exam, PSA of 38.54.  Prostate biopsy revealed a volume of 62 cc with 3 of 12 cores positive with a maximum Gleason score 4+5 at the right apex, right mid-gland, right base.   Staging including CT abdomen and pelvis on 2/18 revealed numerous subcentimeter pulmonary nodules which are indeterminate. There is also several subsequent centimeter osseous sclerotic lesions which were negative on bone scan. He did have a posterior skull focus concerning for sclerotic metastasis but follow-up CT head was negative. Additional imaging includes an MRI of the prostate on 09/27/2016 which showed evidence of extracapsular extension, possible right seminal vesicle invasion and a partial lesion involving the right femoral head/neck not seen on other studies.  He elected external beam radiation with brachytherapy boost as well as total androgen therapy including Lupron and Casodex started 09/2016.  He tolerated 5 weeks of external beam radiation (45 gray completed 12/19/16) then transfer his care to [facility].  He underwent placement of brachytherapy seeds for boost on 8/21/218.  Today, he primarily complains of urinary urgency frequency and nocturia up to 4 times nightly. He is recently seen pulmonology and is returning for a new sleep study. He did have urgency frequency symptoms prior to placement of brachytherapy seeds. He is currently on Flomax which helps his stream and  obstructive symptoms. No dysuria or gross hematuria. No UTIs.  He also complains today of an ingrown hair on his right hemiscrotum. He noted this area several days ago and is compressing a small amount of fluid from it. Its become more tender. He is unable to see the area well.  PMH: Past Medical History:  Diagnosis Date  . Arthritis   . Diabetes ([place])    Type 2  . GERD (gastroesophageal reflux disease)   . History of external beam radiation therapy   . Hypertension   . Prostate cancer ([place])   . Sleep apnea    CPAP at night  . Thyroid disease   . Tobacco abuse     Surgical History: Past Surgical History:  Procedure Laterality Date  . CHOLECYSTECTOMY    . HERNIA REPAIR    . KNEE ARTHROSCOPY Right   . RADIOACTIVE SEED IMPLANT N/A 03/03/2017   Procedure: RADIOACTIVE SEED IMPLANT/BRACHYTHERAPY IMPLANT;  Surgeon: Hollice Espy, MD;  Location: [facility] ORS;  Service: Urology;  Laterality: N/A;  . SHOULDER SURGERY Left 1997   Rotator cuff  . TONSILLECTOMY      Home Medications:  Allergies as of 04/03/2017   No Known Allergies     Medication List       Accurate as of 04/03/17 10:57 AM. Always use your most recent med list.          acetaminophen 500 MG tablet Commonly known as:  TYLENOL Take 1,000 mg by mouth every 6 (six) hours as needed for mild pain or headache.   amLODipine 5 MG tablet Commonly known as:  NORVASC Take 5 mg by mouth every evening.   aspirin  EC 81 MG tablet Take 81 mg by mouth daily.   atorvastatin 20 MG tablet Commonly known as:  LIPITOR Take 20 mg by mouth daily.   bicalutamide 50 MG tablet Commonly known as:  CASODEX Take 50 mg by mouth daily.   cetirizine 10 MG tablet Commonly known as:  ZYRTEC Take 10 mg by mouth daily.   fluticasone 50 MCG/ACT nasal spray Commonly known as:  FLONASE Place 1 spray into both nostrils daily.   glipiZIDE 10 MG tablet Commonly known as:  GLUCOTROL Take 10 mg by mouth 2 (two) times daily before a  meal.   levothyroxine 200 MCG tablet Commonly known as:  SYNTHROID, LEVOTHROID Take 200 mcg by mouth daily before breakfast.   losartan 50 MG tablet Commonly known as:  COZAAR Take 50 mg by mouth every evening.   metFORMIN 500 MG tablet Commonly known as:  GLUCOPHAGE Take 1,000 mg by mouth 2 (two) times daily with a meal.   oxybutynin 5 MG tablet Commonly known as:  DITROPAN Take 1 tablet (5 mg total) by mouth every 8 (eight) hours as needed for bladder spasms.   tamsulosin 0.4 MG Caps capsule Commonly known as:  FLOMAX Take 0.4 mg by mouth daily.            Discharge Care Instructions        Start     Ordered   04/03/17 0000  Urinalysis, Complete     04/03/17 1005   04/03/17 0000  Bladder Scan (Post Void Residual) in office     04/03/17 1047   04/03/17 0000  oxybutynin (DITROPAN) 5 MG tablet  Every 8 hours PRN     04/03/17 1056      Allergies: No Known Allergies  Family History: Family History  Problem Relation Age of Onset  . Hypertension Mother   . Diabetes Father   . Hypertension Father   . Kidney cancer Neg Hx   . Prostate cancer Neg Hx   . Bladder Cancer Neg Hx     Social History:  reports that he has been smoking Cigarettes.  He has a 40.00 pack-year smoking history. He has never used smokeless tobacco. He reports that he does not drink alcohol or use drugs.  ROS: UROLOGY Frequent Urination?: Yes Hard to postpone urination?: Yes Burning/pain with urination?: Yes Get up at night to urinate?: Yes Leakage of urine?: Yes Urine stream starts and stops?: No Trouble starting stream?: No Do you have to strain to urinate?: No Blood in urine?: No Urinary tract infection?: No Sexually transmitted disease?: No Injury to kidneys or bladder?: No Painful intercourse?: No Weak stream?: Yes Erection problems?: No Penile pain?: No  Gastrointestinal Nausea?: Yes Vomiting?: No Indigestion/heartburn?: No Diarrhea?: Yes Constipation?:  No  Constitutional Fever: No Night sweats?: No Weight loss?: No Fatigue?: Yes  Skin Skin rash/lesions?: No Itching?: No  Eyes Blurred vision?: No Double vision?: No  Ears/Nose/Throat Sore throat?: No Sinus problems?: Yes  Hematologic/Lymphatic Swollen glands?: No Easy bruising?: No  Cardiovascular Leg swelling?: No Chest pain?: No  Respiratory Cough?: Yes Shortness of breath?: No  Endocrine Excessive thirst?: No  Musculoskeletal Back pain?: No Joint pain?: Yes  Neurological Headaches?: No Dizziness?: No  Psychologic Depression?: No Anxiety?: No  Physical Exam: BP 120/75 (BP Location: Left Arm, Patient Position: Sitting, Cuff Size: Large)   Pulse 74   Ht 5\' 7"  (1.702 m)   Wt (!) 315 lb (142.9 kg)   BMI 49.34 kg/m   Constitutional:  Alert and  oriented, No acute distress. HEENT: [place] AT, moist mucus membranes.  Trachea midline, no masses. Cardiovascular: No clubbing, cyanosis, or edema. Respiratory: Normal respiratory effort, no increased work of breathing. GI: Abdomen is soft, nontender, nondistended, no abdominal masses.  Obese. GU: Normal phallus. Bilateral descended testicles. Small area of induration on her right deep ended lateral hemiscrotum, approximately 1.5 cm without significant fluctuance. There is an overlying scab but not able to express any fluid from this. Skin: No rashes, bruises or suspicious lesions. Neurologic: Grossly intact, no focal deficits, moving all 4 extremities. Psychiatric: Normal mood and affect.  Laboratory Data: Creatinine 0.8 on 2/18 Hemoglobin A1c 8.9 on 6/18 PSA 48.3 on 1/18  Urinalysis N/a  Pertinent Imaging: Results for orders placed or performed in visit on 04/03/17  Urinalysis, Complete  Result Value Ref Range   Specific Gravity, UA 1.020 1.005 - 1.030   pH, UA 5.5 5.0 - 7.5   Color, UA Yellow Yellow   Appearance Ur Clear Clear   Leukocytes, UA Negative Negative   Protein, UA Negative Negative/Trace    Glucose, UA Negative Negative   Ketones, UA Negative Negative   RBC, UA Negative Negative   Bilirubin, UA Negative Negative   Urobilinogen, Ur 0.2 0.2 - 1.0 mg/dL   Nitrite, UA Negative Negative  Bladder Scan (Post Void Residual) in office  Result Value Ref Range   Scan Result 16     Assessment & Plan:    1. Primary malignant neoplasm of prostate with high risk of recurrence due to Gleason score of 8 to 10 and PSA greater than 20 (HCC) High-risk prostate cancer currently on lupron Status post external beam radiation with brachytherapy boost complete on 02/2017 Due for lupron next Friday- will schedule nurse visit next week for 6 month injection Continue x 2-3 years per guidelines Reviewed bone/ cardiovascular health recommendations - leuprolide (LUPRON) injection 22.5 mg; Inject 22.5 mg into the muscle once.   2. Urinary frequency Continue Flomax Trial ditropan 5 mg po tid prn- 30 day supply given  3. Pulmonary nodules Recently referred to pulmonology for further evaluation of these chronic nodules, being surveyed CPAP adjustment pending, may help with nocturia  4. Scrotal abscess Early scrotal abscess versus sebaceous cyst. Does not appear to be drainable at this time. Minimal surrounding erythema or fluctuance.  Keflex x 7 days, return if fails to resolve or worsens  Hollice Espy, MD  [facility] [address], [place], [place] [address] [phone]

## 2017-04-06 ENCOUNTER — Ambulatory Visit: Payer: No Typology Code available for payment source | Admitting: Radiation Oncology

## 2017-04-06 ENCOUNTER — Ambulatory Visit: Payer: No Typology Code available for payment source

## 2017-04-07 ENCOUNTER — Ambulatory Visit
Admission: RE | Admit: 2017-04-07 | Discharge: 2017-04-07 | Disposition: A | Discharge status: Home / Self Care | Payer: No Typology Code available for payment source | Source: Ambulatory Visit | Attending: Radiation Oncology | Admitting: Radiation Oncology

## 2017-04-07 ENCOUNTER — Other Ambulatory Visit: Payer: Self-pay | Admitting: *Deleted

## 2017-04-07 ENCOUNTER — Encounter: Payer: Self-pay | Admitting: Radiation Oncology

## 2017-04-07 ENCOUNTER — Telehealth: Payer: Self-pay | Admitting: Urology

## 2017-04-07 VITALS — BP 148/82 | HR 77 | Temp 96.6°F | Resp 22 | Wt 320.3 lb

## 2017-04-07 DIAGNOSIS — Z510 Encounter for antineoplastic radiation therapy: Secondary | ICD-10-CM | POA: Insufficient documentation

## 2017-04-07 DIAGNOSIS — C61 Malignant neoplasm of prostate: Secondary | ICD-10-CM | POA: Diagnosis not present

## 2017-04-07 MED ORDER — OXYBUTYNIN CHLORIDE 5 MG PO TABS: 5.0000 mg | ORAL_TABLET | Freq: Three times a day (TID) | ORAL | 0 refills | Status: DC | PRN

## 2017-04-07 NOTE — Progress Notes (Signed)
Radiation Oncology Follow up Note  Name: Evan Newton   Date:   04/07/2017 MRN:  [id number] DOB: 27-Apr-1956    This 61 y.o. male presents to the clinic today for one-month follow-up status post I-125 interstitial implant for boost to his prostate for Gleason 9 adenocarcinoma the prostate.  REFERRING PROVIDER: Stoney Bang,*  HPI: Patient is a 61 year old male now out 1 month having completed I-125 interstitial implant for adenocarcinoma the prostate. Patient has a Gleason 9 (4+5) presenting the PSA in the 38 range. He underwent external beam radiation therapy to both his prostate and pelvic nodes and then was treated with I-125 interstitial implant for boost. He seen 1 month out. We have performed quality assurance on his prostate with seeds on first review showing excellent placement. He has been having some urinary frequency and urgency no specific diarrhea at this point..  COMPLICATIONS OF TREATMENT: none  FOLLOW UP COMPLIANCE: keeps appointments   PHYSICAL EXAM:  BP (!) 148/82   Pulse 77   Temp (!) 96.6 F (35.9 C)   Resp (!) 22   Wt (!) 320 lb 5.3 oz (145.3 kg)   BMI 50.17 kg/m  On rectal exam rectal sphincter tone is good. Prostate is smooth contracted without evidence of nodularity or mass. Sulcus is preserved bilaterally. No discrete nodularity is identified. No other rectal abnormalities are noted. Well-developed well-nourished patient in NAD. HEENT reveals PERLA, EOMI, discs not visualized.  Oral cavity is clear. No oral mucosal lesions are identified. Neck is clear without evidence of cervical or supraclavicular adenopathy. Lungs are clear to A&P. Cardiac examination is essentially unremarkable with regular rate and rhythm without murmur rub or thrill. Abdomen is benign with no organomegaly or masses noted. Motor sensory and DTR levels are equal and symmetric in the upper and lower extremities. Cranial nerves II through XII are grossly intact. Proprioception is intact. No  peripheral adenopathy or edema is identified. No motor or sensory levels are noted. Crude visual fields are within normal range.  RADIOLOGY RESULTS: CT scan of the pelvis was performed for QA on prostate implant  PLAN: At this time patient is doing well of assured him he is still radioactive and prostate is still receiving treatments therefore he is having the following symptoms. He continues on Flomax a half an hour after dinner. I'm otherwise please was overall progress. I have set up for follow-up in about 3-4 months with a PSA prior to that visit. He continues on Lupron suppression. Patient is to call with any concerns.  I would like to take this opportunity to thank you for allowing me to participate in the care of your patient.Armstead Peaks., MD

## 2017-04-07 NOTE — Telephone Encounter (Signed)
Pt called and would like Rx for Oxybutynin called in to his pharmacy

## 2017-04-07 NOTE — Telephone Encounter (Signed)
Script sent  

## 2017-04-08 DIAGNOSIS — Z510 Encounter for antineoplastic radiation therapy: Secondary | ICD-10-CM | POA: Diagnosis not present

## 2017-04-09 ENCOUNTER — Ambulatory Visit (INDEPENDENT_AMBULATORY_CARE_PROVIDER_SITE_OTHER): Payer: No Typology Code available for payment source

## 2017-04-09 DIAGNOSIS — C61 Malignant neoplasm of prostate: Secondary | ICD-10-CM

## 2017-04-09 MED ORDER — LEUPROLIDE ACETATE (6 MONTH) 45 MG IM KIT
45.0000 mg | PACK | Freq: Once | INTRAMUSCULAR | Status: AC
  Administered 2017-04-09: 45 mg via INTRAMUSCULAR

## 2017-04-10 ENCOUNTER — Ambulatory Visit: Payer: No Typology Code available for payment source | Attending: Internal Medicine

## 2017-04-10 DIAGNOSIS — G4733 Obstructive sleep apnea (adult) (pediatric): Secondary | ICD-10-CM | POA: Insufficient documentation

## 2017-04-16 DIAGNOSIS — G4733 Obstructive sleep apnea (adult) (pediatric): Secondary | ICD-10-CM | POA: Diagnosis not present

## 2017-04-17 ENCOUNTER — Telehealth: Payer: Self-pay | Admitting: *Deleted

## 2017-04-17 DIAGNOSIS — G4733 Obstructive sleep apnea (adult) (pediatric): Secondary | ICD-10-CM

## 2017-04-17 NOTE — Telephone Encounter (Signed)
Pt aware of sleep study results Orders placed. Patient also aware DR on vacation until 10/11 so it may take up to 2 weeks before he hears from DME.

## 2017-04-17 NOTE — Telephone Encounter (Signed)
-----   Message from Laverle Hobby, MD sent at 04/16/2017  9:04 PM EDT ----- Regarding: Sleep study results.  Split night positive for OSA.  Recommend:  Auto-CPAP with pressure range of 9-14 cm H2O.

## 2017-06-17 ENCOUNTER — Encounter: Payer: Self-pay | Admitting: Internal Medicine

## 2017-07-01 ENCOUNTER — Other Ambulatory Visit: Payer: Self-pay | Admitting: *Deleted

## 2017-07-23 ENCOUNTER — Encounter: Payer: Self-pay | Admitting: Internal Medicine

## 2017-07-27 ENCOUNTER — Ambulatory Visit (INDEPENDENT_AMBULATORY_CARE_PROVIDER_SITE_OTHER): Payer: No Typology Code available for payment source | Admitting: Internal Medicine

## 2017-07-27 ENCOUNTER — Encounter: Payer: Self-pay | Admitting: Internal Medicine

## 2017-07-27 VITALS — BP 140/78 | HR 70 | Ht 67.0 in | Wt 319.0 lb

## 2017-07-27 DIAGNOSIS — R918 Other nonspecific abnormal finding of lung field: Secondary | ICD-10-CM

## 2017-07-27 DIAGNOSIS — G4733 Obstructive sleep apnea (adult) (pediatric): Secondary | ICD-10-CM | POA: Diagnosis not present

## 2017-07-27 NOTE — Progress Notes (Signed)
[facility] Consultation      Assessment and Plan:  Lung nodules.  -Multiple bilateral lung nodules, largest is 9 mm, will continue to monitor. He has a long history of nodules, going back several years, this could be contiguous with those nodules. -Repeat CT chest in 6 months.  Obstructive sleep apnea.  -Feels that starting pressure is low. -We will increase pressure to new range of 11-18 cm H2O.  Diabetes, essential hypertension, Gerd.  - sleep apnea can contribute to above conditions, therefore, it is important to treat it if present.   Nicotine abuse. -Discussed importance of cessation, greater than 3 minutes. --Discussed possibly starting on Chantix, he would like to consider it, he will call us back if he would like to start the medication and we can send in a prescription for him.  Morbid obesity. -BMI equals 50. Likely contributing 2 sleep apnea.  Orders Placed This Encounter  Procedures  . CT CHEST WO CONTRAST  . AMB REFERRAL FOR DME   Return in about 6 months (around 01/24/2018).   Date: 07/27/2017  MRN# [id number] Evan Newton [birth year]   Evan Newton is a 62 y.o. old male seen in consultation for chief complaint of:    Chief Complaint  Patient presents with  . Follow-up    pt states he wears cpap avg 8-10hr nightly. feels that pressure needs to be stronger.     HPI:   62 y/o with PMH of Type 2 DM, HTN, HLD, obesity, OSA on CPAP, hypothyroidism, prostate CA, tobacco abuse, allergies.  He has been doing well with with the CPAP but he thinks that the pressure is low.    **Review of download data 30 days as of 07/23/17; usage is 30/30 days.  Average usage is 8 hours 42 minutes.  Pressure range is 9-14.  Median pressure is 11, 95th percentile is 13.5, maximum pressure is 14.  Residual AHI is 0.2.  Images personally reviewed; CT chest 03/02/2017; minimal mediastinal lymphadenopathy. There are multiple bilateral nodules, mostly peripheral or  pleural based, largest of which is 9 mm.  Outside reports: CT Chest, abdomen pelvis to 09/05/2016: Emphysema, bilateral subpleural nodules left upper lobe 0.4 cm pulmonary nodule subcentimeter nodules in the liver. CT head. 09/12/16; no acute findings   PMHX:   Past Medical History:  Diagnosis Date  . Arthritis   . Diabetes ([place])    Type 2  . GERD (gastroesophageal reflux disease)   . History of external beam radiation therapy   . Hypertension   . Prostate cancer ([place])   . Sleep apnea    CPAP at night  . Thyroid disease   . Tobacco abuse    Surgical Hx:  Past Surgical History:  Procedure Laterality Date  . CHOLECYSTECTOMY    . HERNIA REPAIR    . KNEE ARTHROSCOPY Right   . RADIOACTIVE SEED IMPLANT N/A 03/03/2017   Procedure: RADIOACTIVE SEED IMPLANT/BRACHYTHERAPY IMPLANT;  Surgeon: Hollice Espy, MD;  Location: [facility] ORS;  Service: Urology;  Laterality: N/A;  . SHOULDER SURGERY Left 1997   Rotator cuff  . TONSILLECTOMY     Family Hx:  Family History  Problem Relation Age of Onset  . Hypertension Mother   . Diabetes Father   . Hypertension Father   . Kidney cancer Neg Hx   . Prostate cancer Neg Hx   . Bladder Cancer Neg Hx    Social Hx:   Social History   Tobacco Use  . Smoking status: Current Every Day  Smoker    Packs/day: 1.00    Years: 40.00    Pack years: 40.00    Types: Cigarettes  . Smokeless tobacco: Never Used  Substance Use Topics  . Alcohol use: No  . Drug use: No   Medication:    Current Outpatient Medications:  .  acetaminophen (TYLENOL) 500 MG tablet, Take 1,000 mg by mouth every 6 (six) hours as needed for mild pain or headache., Disp: , Rfl:  .  amLODipine (NORVASC) 5 MG tablet, Take 5 mg by mouth every evening. , Disp: , Rfl:  .  aspirin EC 81 MG tablet, Take 81 mg by mouth daily., Disp: , Rfl:  .  atorvastatin (LIPITOR) 20 MG tablet, Take 20 mg by mouth daily., Disp: , Rfl:  .  bicalutamide (CASODEX) 50 MG tablet, Take 50 mg by mouth  daily., Disp: , Rfl:  .  cephALEXin (KEFLEX) 500 MG capsule, Take 1 capsule (500 mg total) by mouth 4 (four) times daily., Disp: 28 capsule, Rfl: 0 .  cetirizine (ZYRTEC) 10 MG tablet, Take 10 mg by mouth daily., Disp: , Rfl:  .  fluticasone (FLONASE) 50 MCG/ACT nasal spray, Place 1 spray into both nostrils daily. , Disp: , Rfl:  .  glipiZIDE (GLUCOTROL) 10 MG tablet, Take 10 mg by mouth 2 (two) times daily before a meal. , Disp: , Rfl:  .  levothyroxine (SYNTHROID, LEVOTHROID) 200 MCG tablet, Take 200 mcg by mouth daily before breakfast., Disp: , Rfl:  .  losartan (COZAAR) 50 MG tablet, Take 50 mg by mouth every evening. , Disp: , Rfl:  .  metFORMIN (GLUCOPHAGE) 500 MG tablet, Take 1,000 mg by mouth 2 (two) times daily with a meal., Disp: , Rfl:  .  oxybutynin (DITROPAN) 5 MG tablet, Take 1 tablet (5 mg total) by mouth every 8 (eight) hours as needed for bladder spasms., Disp: 30 tablet, Rfl: 0 .  tamsulosin (FLOMAX) 0.4 MG CAPS capsule, Take 0.4 mg by mouth daily. , Disp: , Rfl:    Allergies:  Patient has no known allergies.  Review of Systems: Gen:  Denies  fever, sweats, chills HEENT: Denies blurred vision, double vision. bleeds, sore throat Cvc:  No dizziness, chest pain. Resp:   Denies cough or sputum production, shortness of breath Gi: Denies swallowing difficulty, stomach pain. Gu:  Denies bladder incontinence, burning urine Ext:   No Joint pain, stiffness. Skin: No skin rash,  hives  Endoc:  No polyuria, polydipsia. Psych: No depression, insomnia. Other:  All other systems were reviewed with the patient and were negative other that what is mentioned in the HPI.   Physical Examination:   VS: There were no vitals taken for this visit.  General Appearance: No distress . Appears fatigued, obese.  Neuro:without focal findings,  speech normal,  HEENT: PERRLA, EOM intact.   Pulmonary: normal breath sounds, No wheezing.  CardiovascularNormal S1,S2.  No m/r/g.   Abdomen: Benign,  Soft, non-tender. Renal:  No costovertebral tenderness  GU:  No performed at this time. Endoc: No evident thyromegaly, no signs of acromegaly. Skin:   warm, no rashes, no ecchymosis  Extremities: normal, no cyanosis, clubbing.  Other findings:    LABORATORY PANEL:   CBC No results for input(s): WBC, HGB, HCT, PLT in the last 168 hours. ------------------------------------------------------------------------------------------------------------------  Chemistries  No results for input(s): NA, K, CL, CO2, GLUCOSE, BUN, CREATININE, CALCIUM, MG, AST, ALT, ALKPHOS, BILITOT in the last 168 hours.  Invalid input(s): GFRCGP ------------------------------------------------------------------------------------------------------------------  Cardiac Enzymes No results  for input(s): TROPONINI in the last 168 hours. ------------------------------------------------------------  RADIOLOGY:  No results found.     Thank  you for the consultation and for allowing [facility] to assist in the care of your patient. Our recommendations are noted above.  Please contact us if we can be of further service.   Marda Stalker, MD.  Board Certified in Internal Medicine, Pulmonary Medicine, [place], and Sleep Medicine.  [facility] Office Number: [phone]  Patricia Pesa, M.D.  Merton Border, M.D  07/27/2017

## 2017-07-27 NOTE — Patient Instructions (Signed)
Continue using CPAP every night.   Will change pressure range to 11-18 cm H2O.

## 2017-08-05 ENCOUNTER — Inpatient Hospital Stay: Payer: No Typology Code available for payment source | Attending: Radiation Oncology

## 2017-08-05 DIAGNOSIS — Z923 Personal history of irradiation: Secondary | ICD-10-CM | POA: Insufficient documentation

## 2017-08-05 DIAGNOSIS — R110 Nausea: Secondary | ICD-10-CM | POA: Diagnosis not present

## 2017-08-05 DIAGNOSIS — R3915 Urgency of urination: Secondary | ICD-10-CM | POA: Insufficient documentation

## 2017-08-05 DIAGNOSIS — R197 Diarrhea, unspecified: Secondary | ICD-10-CM | POA: Diagnosis not present

## 2017-08-05 DIAGNOSIS — C61 Malignant neoplasm of prostate: Secondary | ICD-10-CM

## 2017-08-05 DIAGNOSIS — R351 Nocturia: Secondary | ICD-10-CM | POA: Diagnosis not present

## 2017-08-05 DIAGNOSIS — R350 Frequency of micturition: Secondary | ICD-10-CM | POA: Diagnosis not present

## 2017-08-05 DIAGNOSIS — R918 Other nonspecific abnormal finding of lung field: Secondary | ICD-10-CM

## 2017-08-06 LAB — PSA: Prostatic Specific Antigen: 0.01 ng/mL (ref 0.00–4.00)

## 2017-08-12 ENCOUNTER — Other Ambulatory Visit: Payer: Self-pay

## 2017-08-12 ENCOUNTER — Ambulatory Visit
Admission: RE | Admit: 2017-08-12 | Discharge: 2017-08-12 | Disposition: A | Discharge status: Home / Self Care | Payer: No Typology Code available for payment source | Source: Ambulatory Visit | Attending: Radiation Oncology | Admitting: Radiation Oncology

## 2017-08-12 ENCOUNTER — Other Ambulatory Visit: Payer: Self-pay | Admitting: *Deleted

## 2017-08-12 ENCOUNTER — Encounter: Payer: Self-pay | Admitting: Radiation Oncology

## 2017-08-12 VITALS — BP 135/77 | HR 74 | Temp 97.5°F | Resp 22 | Wt 320.1 lb

## 2017-08-12 DIAGNOSIS — C61 Malignant neoplasm of prostate: Secondary | ICD-10-CM | POA: Insufficient documentation

## 2017-08-12 DIAGNOSIS — R350 Frequency of micturition: Secondary | ICD-10-CM | POA: Insufficient documentation

## 2017-08-12 DIAGNOSIS — R197 Diarrhea, unspecified: Secondary | ICD-10-CM | POA: Insufficient documentation

## 2017-08-12 DIAGNOSIS — R3915 Urgency of urination: Secondary | ICD-10-CM | POA: Insufficient documentation

## 2017-08-12 DIAGNOSIS — R110 Nausea: Secondary | ICD-10-CM | POA: Diagnosis not present

## 2017-08-12 DIAGNOSIS — Z923 Personal history of irradiation: Secondary | ICD-10-CM | POA: Diagnosis not present

## 2017-08-12 MED ORDER — SUCRALFATE 1 G PO TABS: 1.0000 g | ORAL_TABLET | Freq: Two times a day (BID) | ORAL | 6 refills | Status: AC

## 2017-08-12 MED ORDER — MIRABEGRON ER 25 MG PO TB24: 25.0000 mg | ORAL_TABLET | Freq: Every day | ORAL | 6 refills | Status: DC

## 2017-08-12 NOTE — Progress Notes (Signed)
Radiation Oncology Follow up Note  Name: Evan Newton   Date:   08/12/2017 MRN:  [id number] DOB: Jul 25, 1955    This 62 y.o. male presents to the clinic today for 54-month follow-up status post I-125 interstitial implant for boost for Gleason 9 adenocarcinoma the prostate status post external beam radiation therapy to his pelvic nodes at [facility].  REFERRING PROVIDER: Stoney Bang,*  HPI: Patient is a 62 year old male now seen after 5 months since completing I-125 interstitial implant for boost to his prostate status post external beam radiation therapy to his prostate and pelvic nodes at [facility].  He is seen today and is having multiple somatic complaints including chronic diarrhea urinary frequency urgency and nocturia and nausea.  He is discontinued follow-up care at [facility].  His most recent PSA was 0.01.Marland Kitchen  COMPLICATIONS OF TREATMENT: present  FOLLOW UP COMPLIANCE: keeps appointments   PHYSICAL EXAM:  BP 135/77   Pulse 74   Temp (!) 97.5 F (36.4 C)   Resp (!) 22   Wt (!) 320 lb 1.7 oz (145.2 kg)   BMI 50.14 kg/m  Well-developed well-nourished patient in NAD. HEENT reveals PERLA, EOMI, discs not visualized.  Oral cavity is clear. No oral mucosal lesions are identified. Neck is clear without evidence of cervical or supraclavicular adenopathy. Lungs are clear to A&P. Cardiac examination is essentially unremarkable with regular rate and rhythm without murmur rub or thrill. Abdomen is benign with no organomegaly or masses noted. Motor sensory and DTR levels are equal and symmetric in the upper and lower extremities. Cranial nerves II through XII are grossly intact. Proprioception is intact. No peripheral adenopathy or edema is identified. No motor or sensory levels are noted. Crude visual fields are within normal range.  RADIOLOGY RESULTS: No current films for review  PLAN: At the present time I am starting him on Myrbetriq for his bladder spasming.  I am also starting on Carafate 1 g twice a  day to help with some of his possible diarrhea and GI upset.  He is also currently on Flomax but that after dinner I have asked him to go to twice a day should his symptoms not improve.  He is under good biochemical control of his prostate cancer.  I have asked to see him back in 6 months for follow-up.  I also will continue Lupron therapy for at least a year and 1/2-2 years and will research when his last Lupron injection was given.  Patient is to call with any concerns.  I would like to take this opportunity to thank you for allowing me to participate in the care of your patient.Noreene Filbert, MD

## 2017-10-09 ENCOUNTER — Other Ambulatory Visit: Payer: Self-pay

## 2017-10-09 ENCOUNTER — Other Ambulatory Visit: Payer: No Typology Code available for payment source

## 2017-10-09 DIAGNOSIS — C61 Malignant neoplasm of prostate: Secondary | ICD-10-CM

## 2017-10-10 LAB — PSA: Prostate Specific Ag, Serum: <0.1 ng/mL (ref 0.0–4.0)

## 2017-10-13 ENCOUNTER — Encounter: Payer: Self-pay | Admitting: Urology

## 2017-10-13 ENCOUNTER — Ambulatory Visit (INDEPENDENT_AMBULATORY_CARE_PROVIDER_SITE_OTHER): Payer: No Typology Code available for payment source | Admitting: Urology

## 2017-10-13 VITALS — BP 146/79 | HR 82 | Ht 67.0 in | Wt 320.0 lb

## 2017-10-13 DIAGNOSIS — R350 Frequency of micturition: Secondary | ICD-10-CM

## 2017-10-13 DIAGNOSIS — C61 Malignant neoplasm of prostate: Secondary | ICD-10-CM

## 2017-10-13 DIAGNOSIS — R300 Dysuria: Secondary | ICD-10-CM

## 2017-10-13 LAB — BLADDER SCAN AMB NON-IMAGING

## 2017-10-13 MED ORDER — TAMSULOSIN HCL 0.4 MG PO CAPS: 0.4000 mg | ORAL_CAPSULE | Freq: Every day | ORAL | 11 refills | Status: AC

## 2017-10-13 MED ORDER — LEUPROLIDE ACETATE (6 MONTH) 45 MG IM KIT
45.0000 mg | PACK | Freq: Once | INTRAMUSCULAR | Status: AC
  Administered 2017-10-13: 45 mg via INTRAMUSCULAR

## 2017-10-13 NOTE — Progress Notes (Signed)
10/13/2017 3:53 PM   Evan Newton Aug 19, 1955 [id number]  Referring provider: Stoney Bang, MD [address], [place] [address]  Chief Complaint  Patient presents with  . Prostate Cancer    54month    HPI: 62 year old male with high risk prostate cancer, stage TIIa dx 2018 s/p EBRT/ brachytherapy boost currently on ADT.  He returns today for ongoing ADT.  He was initially diagnosed by Dr. Bernardo Heater on 07/29/2016 with an abnormal rectal exam, PSA of 38.54.  Prostate biopsy revealed a volume of 62 cc with 3 of 12 cores positive with a maximum Gleason score 4+5 at the right apex, right mid-gland, right base.   Staging including CT abdomen and pelvis on 2/18 revealed numerous subcentimeter pulmonary nodules which are indeterminate. There is also several subsequent centimeter osseous sclerotic lesions which were negative on bone scan. He did have a posterior skull focus concerning for sclerotic metastasis but follow-up CT head was negative. Additional imaging includes an MRI of the prostate on 09/27/2016 which showed evidence of extracapsular extension, possible right seminal vesicle invasion and a lesion involving the right femoral head/neck not seen on other studies.  He elected external beam radiation with brachytherapy boost as well as total androgen therapy including Lupron and Casodex started 09/2016.  He tolerated 5 weeks of external beam radiation (45 gray completed 12/19/16) then transfer his care to [facility].  He underwent placement of brachytherapy seeds for boost on 8/21/218.    Today, he primarily complains of urinary urgency frequency and nocturia up to 4 times nightly which is unchanged.  He has OSA and is compliant.  He does have daytime urinary frequency, urgency without incontinence.    He took flomax around the time of brachytherapy but believes ran out.  He did not think Mybetriq helped him whatsoever.  He was light headed with oxybutynin in  combination with his other drugs.    He occationally has burning with urination which comes and goes and with occational penile pain.  He also see sediment sometimes.  No recent UA/ UCx.    Postvoid residual today is minimal.  Overall, he feels like his urinary symptoms are improving since radiation/brachy seed implant.  PMH: Past Medical History:  Diagnosis Date  . Arthritis   . Diabetes ([place])    Type 2  . GERD (gastroesophageal reflux disease)   . History of external beam radiation therapy   . Hypertension   . Prostate cancer ([place])   . Sleep apnea    CPAP at night  . Thyroid disease   . Tobacco abuse     Surgical History: Past Surgical History:  Procedure Laterality Date  . CHOLECYSTECTOMY    . HERNIA REPAIR    . KNEE ARTHROSCOPY Right   . RADIOACTIVE SEED IMPLANT N/A 03/03/2017   Procedure: RADIOACTIVE SEED IMPLANT/BRACHYTHERAPY IMPLANT;  Surgeon: Hollice Espy, MD;  Location: [facility] ORS;  Service: Urology;  Laterality: N/A;  . SHOULDER SURGERY Left 1997   Rotator cuff  . TONSILLECTOMY      Home Medications:  Allergies as of 10/13/2017   No Known Allergies     Medication List        Accurate as of 10/13/17  3:53 PM. Always use your most recent med list.          acetaminophen 500 MG tablet Commonly known as:  TYLENOL Take 1,000 mg by mouth every 6 (six) hours as needed for mild pain or headache.   amLODipine 5 MG  tablet Commonly known as:  NORVASC Take 5 mg by mouth every evening.   aspirin EC 81 MG tablet Take 81 mg by mouth daily.   atorvastatin 20 MG tablet Commonly known as:  LIPITOR Take 20 mg by mouth daily.   bicalutamide 50 MG tablet Commonly known as:  CASODEX Take 50 mg by mouth daily.   cetirizine 10 MG tablet Commonly known as:  ZYRTEC Take 10 mg by mouth daily.   fluticasone 50 MCG/ACT nasal spray Commonly known as:  FLONASE Place 1 spray into both nostrils daily.   glipiZIDE 10 MG tablet Commonly known as:  GLUCOTROL Take 10  mg by mouth 2 (two) times daily before a meal.   levothyroxine 200 MCG tablet Commonly known as:  SYNTHROID, LEVOTHROID Take 200 mcg by mouth daily before breakfast.   losartan 50 MG tablet Commonly known as:  COZAAR Take 50 mg by mouth every evening.   metFORMIN 500 MG tablet Commonly known as:  GLUCOPHAGE Take 1,000 mg by mouth 2 (two) times daily with a meal.   mirabegron ER 25 MG Tb24 tablet Commonly known as:  MYRBETRIQ Take 1 tablet (25 mg total) by mouth daily.   sucralfate 1 g tablet Commonly known as:  CARAFATE Take 1 tablet (1 g total) by mouth 2 (two) times daily.   tamsulosin 0.4 MG Caps capsule Commonly known as:  FLOMAX Take 0.4 mg by mouth daily.   TRULICITY 4.09 WJ/1.9JY Sopn Generic drug:  Dulaglutide   valsartan 160 MG tablet Commonly known as:  DIOVAN Take 160 mg by mouth.       Allergies: No Known Allergies  Family History: Family History  Problem Relation Age of Onset  . Hypertension Mother   . Diabetes Father   . Hypertension Father   . Kidney cancer Neg Hx   . Prostate cancer Neg Hx   . Bladder Cancer Neg Hx     Social History:  reports that he has been smoking cigarettes.  He has a 40.00 pack-year smoking history. He has never used smokeless tobacco. He reports that he does not drink alcohol or use drugs.  ROS: UROLOGY Frequent Urination?: Yes Hard to postpone urination?: Yes Burning/pain with urination?: Yes Get up at night to urinate?: Yes Leakage of urine?: No Urine stream starts and stops?: Yes Trouble starting stream?: No Do you have to strain to urinate?: No Blood in urine?: No Urinary tract infection?: No Sexually transmitted disease?: No Injury to kidneys or bladder?: No Painful intercourse?: No Weak stream?: No Erection problems?: No Penile pain?: No  Gastrointestinal Nausea?: Yes Vomiting?: No Indigestion/heartburn?: No Diarrhea?: Yes Constipation?: No  Constitutional Fever: No Night sweats?: No Weight  loss?: No Fatigue?: Yes  Skin Skin rash/lesions?: No Itching?: No  Eyes Blurred vision?: No Double vision?: No  Ears/Nose/Throat Sore throat?: No Sinus problems?: Yes  Hematologic/Lymphatic Swollen glands?: No Easy bruising?: No  Cardiovascular Leg swelling?: No Chest pain?: No  Respiratory Cough?: Yes Shortness of breath?: No  Endocrine Excessive thirst?: No  Musculoskeletal Back pain?: Yes Joint pain?: Yes  Neurological Headaches?: No Dizziness?: No  Psychologic Depression?: No Anxiety?: No  Physical Exam: BP (!) 146/79   Pulse 82   Ht 5\' 7"  (1.702 m)   Wt (!) 320 lb (145.2 kg)   BMI 50.12 kg/m   Constitutional:  Alert and oriented, No acute distress. HEENT: [place] AT, moist mucus membranes.  Trachea midline, no masses.  Wearing Breathe Right strip. Respiratory: Normal respiratory effort, no increased work of  breathing. GI: Obese. Skin: No rashes, bruises or suspicious lesions. Neurologic: Grossly intact, no focal deficits, moving all 4 extremities. Psychiatric: Normal mood and affect.  Laboratory Data: Creatinine 0.8 on 2/18 Hemoglobin A1c 8.9 on 6/18 PSA 48.3 on 1/18  Component     Latest Ref Rng & Units 10/09/2017  Prostate Specific Ag, Serum     0.0 - 4.0 ng/mL <0.1    Urinalysis N/a  Pertinent Imaging: Results for orders placed or performed in visit on 10/09/17  PSA  Result Value Ref Range   Prostate Specific Ag, Serum <0.1 0.0 - 4.0 ng/mL    Assessment & Plan:    1. Primary malignant neoplasm of prostate with high risk of recurrence due to Gleason score of 8 to 10 and PSA greater than 20 (HCC) High-risk prostate cancer currently on lupron x 2-3 years Status post external beam radiation with brachytherapy boost complete on 02/2017 Due for lupron today, 6 mo depo given - leuprolide (LUPRON) injection 22.5 mg; Inject 22.5 mg into the muscle once.   2. Urinary frequency/ dysuria Continue Flomax (resume) Has tried multiple  anticholinergics and not interested in further medical therapy other than as above UA/ UCx to r/o infection is contributing factor   F/u in 6 months for lupron depo  Hollice Espy, MD  [facility] [address], [place], [place] [address] [phone]

## 2017-10-13 NOTE — Progress Notes (Signed)
Lupron IM Injection   Due to Prostate Cancer patient is present today for a Lupron Injection.  Medication: Lupron 6 month Dose: 45 mg  Location: left upper outer buttocks Lot: [id number] Exp: [id number]  Patient tolerated well, no complications were noted  Performed by: Fonnie Jarvis, CMA  Follow up: 6 months

## 2017-10-14 LAB — URINALYSIS, COMPLETE
Bilirubin, UA: NEGATIVE
GLUCOSE, UA: NEGATIVE
KETONES UA: NEGATIVE
Leukocytes, UA: NEGATIVE
NITRITE UA: NEGATIVE
PROTEIN UA: NEGATIVE
RBC, UA: NEGATIVE
SPEC GRAV UA: 1.025 (ref 1.005–1.030)
UUROB: 0.2 mg/dL (ref 0.2–1.0)
pH, UA: 5.5 (ref 5.0–7.5)

## 2017-10-14 LAB — MICROSCOPIC EXAMINATION
BACTERIA UA: NONE SEEN
Epithelial Cells (non renal): NONE SEEN /hpf (ref 0–10)

## 2017-10-15 LAB — CULTURE, URINE COMPREHENSIVE

## 2017-11-10 ENCOUNTER — Encounter: Payer: Self-pay | Admitting: *Deleted

## 2017-11-12 ENCOUNTER — Other Ambulatory Visit: Payer: Self-pay

## 2017-11-12 DIAGNOSIS — R1110 Vomiting, unspecified: Secondary | ICD-10-CM | POA: Diagnosis not present

## 2017-11-12 DIAGNOSIS — Z5321 Procedure and treatment not carried out due to patient leaving prior to being seen by health care provider: Secondary | ICD-10-CM | POA: Diagnosis not present

## 2017-11-12 LAB — CBC
HEMATOCRIT: 47.4 % (ref 40.0–52.0)
HEMOGLOBIN: 15.8 g/dL (ref 13.0–18.0)
MCH: 31.3 pg (ref 26.0–34.0)
MCHC: 33.3 g/dL (ref 32.0–36.0)
MCV: 94 fL (ref 80.0–100.0)
Platelets: 300 10*3/uL (ref 150–440)
RBC: 5.05 MIL/uL (ref 4.40–5.90)
RDW: 15.6 % — ABNORMAL HIGH (ref 11.5–14.5)
WBC: 16.5 10*3/uL — ABNORMAL HIGH (ref 3.8–10.6)

## 2017-11-12 MED ORDER — ONDANSETRON 4 MG PO TBDP
8.0000 mg | ORAL_TABLET | Freq: Once | ORAL | Status: AC
  Administered 2017-11-12: 8 mg via ORAL

## 2017-11-12 MED ORDER — ONDANSETRON 8 MG PO TBDP
ORAL_TABLET | ORAL | Status: AC
  Filled 2017-11-12: qty 1

## 2017-11-12 NOTE — ED Triage Notes (Signed)
Pt in with co n.v.d since 1900 today states think it may be from old chicken salad he had for lunch.

## 2017-11-13 ENCOUNTER — Emergency Department
Admission: EM | Admit: 2017-11-13 | Discharge: 2017-11-13 | Disposition: A | Discharge status: Home / Self Care | Payer: No Typology Code available for payment source | Attending: Emergency Medicine | Admitting: Emergency Medicine

## 2017-11-13 LAB — URINALYSIS, COMPLETE (UACMP) WITH MICROSCOPIC
BILIRUBIN URINE: NEGATIVE
Glucose, UA: NEGATIVE mg/dL
Ketones, ur: NEGATIVE mg/dL
Nitrite: NEGATIVE
Protein, ur: NEGATIVE mg/dL
Specific Gravity, Urine: 1.006 (ref 1.005–1.030)
WBC, UA: >50 WBC/hpf — ABNORMAL HIGH (ref 0–5)
pH: 7 (ref 5.0–8.0)

## 2017-11-13 LAB — COMPREHENSIVE METABOLIC PANEL
ALK PHOS: 106 U/L (ref 38–126)
ALT: 27 U/L (ref 17–63)
ANION GAP: 14 (ref 5–15)
AST: 28 U/L (ref 15–41)
Albumin: 4.2 g/dL (ref 3.5–5.0)
BUN: 25 mg/dL — ABNORMAL HIGH (ref 6–20)
CALCIUM: 9.1 mg/dL (ref 8.9–10.3)
CO2: 22 mmol/L (ref 22–32)
CREATININE: 0.98 mg/dL (ref 0.61–1.24)
Chloride: 102 mmol/L (ref 101–111)
Glucose, Bld: 264 mg/dL — ABNORMAL HIGH (ref 65–99)
Potassium: 3.9 mmol/L (ref 3.5–5.1)
Sodium: 138 mmol/L (ref 135–145)
Total Bilirubin: 0.9 mg/dL (ref 0.3–1.2)
Total Protein: 8.1 g/dL (ref 6.5–8.1)

## 2017-11-13 LAB — LIPASE, BLOOD: LIPASE: 24 U/L (ref 11–51)

## 2017-11-13 NOTE — ED Provider Notes (Signed)
When I went to evaluate the patient, he is not in treatment room, no belongings present. Will check to see if he has LWBS or if he returns to the room.   Carrie Mew, MD 11/13/17 [phone]

## 2017-11-13 NOTE — ED Notes (Addendum)
Pt noted in waiting room walking out the door as he stated "I am leaving the doctor never saw me". Encouraged patient to wait and be seen but left without response.

## 2017-11-13 NOTE — ED Notes (Signed)
MD went to assess patient and BP cuff and pulse ox had been removed and patient was no longer in room. Per first nurse, patient was seen walking out of lobby.

## 2018-01-24 ENCOUNTER — Encounter: Payer: Self-pay | Admitting: Internal Medicine

## 2018-01-25 ENCOUNTER — Ambulatory Visit
Admission: RE | Admit: 2018-01-25 | Discharge: 2018-01-25 | Disposition: A | Discharge status: Home / Self Care | Payer: No Typology Code available for payment source | Source: Ambulatory Visit | Attending: Internal Medicine | Admitting: Internal Medicine

## 2018-01-25 DIAGNOSIS — R918 Other nonspecific abnormal finding of lung field: Secondary | ICD-10-CM | POA: Insufficient documentation

## 2018-01-25 DIAGNOSIS — I2510 Atherosclerotic heart disease of native coronary artery without angina pectoris: Secondary | ICD-10-CM | POA: Insufficient documentation

## 2018-01-25 DIAGNOSIS — K760 Fatty (change of) liver, not elsewhere classified: Secondary | ICD-10-CM | POA: Insufficient documentation

## 2018-01-25 DIAGNOSIS — D3501 Benign neoplasm of right adrenal gland: Secondary | ICD-10-CM | POA: Diagnosis not present

## 2018-01-25 DIAGNOSIS — J439 Emphysema, unspecified: Secondary | ICD-10-CM | POA: Diagnosis not present

## 2018-01-25 NOTE — Progress Notes (Signed)
[facility] Consultation      Assessment and Plan:  Lung nodules.  -Multiple bilateral lung nodules, largest is 9 mm, no significant change from previous.  -Repeat CT chest in 1 year.  Obstructive sleep apnea.  -Doing well with CPAP, continue at pressure of 11-18 cm H2O.  Diabetes, essential hypertension, Gerd.  - sleep apnea can contribute to above conditions, therefore, it is important to treat it if present.   Nicotine abuse. -Discussed importance of cessation, greater than 3 minutes.  Not seriously considering quitting at this time  Morbid obesity. -BMI equals 50. Likely contributing 2 sleep apnea. Weight loss may be beneficial.   Orders Placed This Encounter  Procedures  . CT CHEST WO CONTRAST   Return in about 1 year (around 01/27/2019) for in 1 year after CT chest. .   Date: 01/25/2018  MRN# [id number] Evan Newton 11-17-1955   Evan Newton is a 62 y.o. old male seen in consultation for chief complaint of:    Chief Complaint  Patient presents with  . Sleep Apnea    DME:[facility] CPAP doing well    HPI:   62 y/o with PMH of Type 2 DM, HTN, HLD, obesity, OSA on CPAP, hypothyroidism, prostate CA, tobacco abuse, allergies. At last visit felt that pressure was low, so increased range from 9-14 to 11-18. He was recommended smoking cessation and asked to repeat CT chest for nodules.   He has been doing well with his cpap, he is more awake during the day and is tolerating it well. He continues to smoke about a 1.5 ppd, he is not really thinking about quitting now.   **Review of download, 12/26/17 to 01/24/2018>> usage greater than 4 hours is 30/30 days.  Average usage on days used is 8 hours 37 minutes.  Pressure ranges 11-15.  Median pressure 11, 95th percentile pressure 14, maximum pressure 15.  Residual AHI 0.1.  Shows excellent compliance with CPAP with excellent control of obstructive sleep apnea. **Review of download data 30 days as of 07/23/17; usage is 30/30  days.  Average usage is 8 hours 42 minutes.  Pressure range is 9-14.  Median pressure is 11, 95th percentile is 13.5, maximum pressure is 14.  Residual AHI is 0.2.  **CT chest 01/25/18>> images personally reviewed.  multiple bilateral tiny nodules, no change in comparison with previous.  **CT chest 03/02/2017>> minimal mediastinal lymphadenopathy. There are multiple bilateral nodules, mostly peripheral or pleural based, largest of which is 9 mm.  Outside reports: CT Chest, abdomen pelvis to 09/05/2016: Emphysema, bilateral subpleural nodules left upper lobe 0.4 cm pulmonary nodule subcentimeter nodules in the liver. CT head. 09/12/16; no acute findings  Social Hx:   Social History   Tobacco Use  . Smoking status: Current Every Day Smoker    Packs/day: 1.00    Years: 40.00    Pack years: 40.00    Types: Cigarettes  . Smokeless tobacco: Never Used  Substance Use Topics  . Alcohol use: No  . Drug use: No   Medication:    Current Outpatient Medications:  .  acetaminophen (TYLENOL) 500 MG tablet, Take 1,000 mg by mouth every 6 (six) hours as needed for mild pain or headache., Disp: , Rfl:  .  amLODipine (NORVASC) 5 MG tablet, Take 5 mg by mouth every evening. , Disp: , Rfl:  .  aspirin EC 81 MG tablet, Take 81 mg by mouth daily., Disp: , Rfl:  .  atorvastatin (LIPITOR) 20 MG tablet, Take 20  mg by mouth daily., Disp: , Rfl:  .  bicalutamide (CASODEX) 50 MG tablet, Take 50 mg by mouth daily., Disp: , Rfl:  .  cetirizine (ZYRTEC) 10 MG tablet, Take 10 mg by mouth daily., Disp: , Rfl:  .  fluticasone (FLONASE) 50 MCG/ACT nasal spray, Place 1 spray into both nostrils daily. , Disp: , Rfl:  .  glipiZIDE (GLUCOTROL) 10 MG tablet, Take 10 mg by mouth 2 (two) times daily before a meal. , Disp: , Rfl:  .  levothyroxine (SYNTHROID, LEVOTHROID) 200 MCG tablet, Take 200 mcg by mouth daily before breakfast., Disp: , Rfl:  .  losartan (COZAAR) 50 MG tablet, Take 50 mg by mouth every evening. , Disp: ,  Rfl:  .  metFORMIN (GLUCOPHAGE) 500 MG tablet, Take 1,000 mg by mouth 2 (two) times daily with a meal., Disp: , Rfl:  .  sucralfate (CARAFATE) 1 g tablet, Take 1 tablet (1 g total) by mouth 2 (two) times daily., Disp: 60 tablet, Rfl: 6 .  tamsulosin (FLOMAX) 0.4 MG CAPS capsule, Take 1 capsule (0.4 mg total) by mouth daily., Disp: 30 capsule, Rfl: 11 .  TRULICITY 6.29 BM/8.4XL SOPN, , Disp: , Rfl:  .  valsartan (DIOVAN) 160 MG tablet, Take 160 mg by mouth., Disp: , Rfl:    Allergies:  Patient has no known allergies.  Review of Systems:  Constitutional: Feels well. Cardiovascular: No chest pain.  Pulmonary: Denies dyspnea.   The remainder of systems were reviewed and were found to be negative other than what is documented in the HPI.    Physical Examination:   VS: BP 132/78 (BP Location: Left Arm, Cuff Size: Large)   Pulse 94   Ht 5\' 7"  (1.702 m)   SpO2 95%   BMI 50.12 kg/m   General Appearance: No distress  Neuro:without focal findings, mental status, speech normal, alert and oriented HEENT: PERRLA, EOM intact Pulmonary: No wheezing, No rales  CardiovascularNormal S1,S2.  No m/r/g.  Abdomen: Benign, Soft, non-tender, No masses Renal:  No costovertebral tenderness  GU:  No performed at this time. Endoc: No evident thyromegaly, no signs of acromegaly or Cushing features Skin:   warm, no rashes, no ecchymosis  Extremities: normal, no cyanosis, clubbing.     LABORATORY PANEL:   CBC No results for input(s): WBC, HGB, HCT, PLT in the last 168 hours. ------------------------------------------------------------------------------------------------------------------  Chemistries  No results for input(s): NA, K, CL, CO2, GLUCOSE, BUN, CREATININE, CALCIUM, MG, AST, ALT, ALKPHOS, BILITOT in the last 168 hours.  Invalid input(s): GFRCGP ------------------------------------------------------------------------------------------------------------------  Cardiac Enzymes No results  for input(s): TROPONINI in the last 168 hours. ------------------------------------------------------------  RADIOLOGY:  No results found.     Thank  you for the consultation and for allowing [facility] to assist in the care of your patient. Our recommendations are noted above.  Please contact us if we can be of further service.   Marda Stalker, MD.  Board Certified in Internal Medicine, Pulmonary Medicine, [place], and Sleep Medicine.  [facility] Office Number: [phone]  Patricia Pesa, M.D.  Merton Border, M.D  01/25/2018

## 2018-01-26 ENCOUNTER — Ambulatory Visit (INDEPENDENT_AMBULATORY_CARE_PROVIDER_SITE_OTHER): Payer: No Typology Code available for payment source | Admitting: Internal Medicine

## 2018-01-26 ENCOUNTER — Encounter: Payer: Self-pay | Admitting: Internal Medicine

## 2018-01-26 VITALS — BP 132/78 | HR 94 | Ht 67.0 in

## 2018-01-26 DIAGNOSIS — R911 Solitary pulmonary nodule: Secondary | ICD-10-CM | POA: Diagnosis not present

## 2018-01-26 DIAGNOSIS — G4733 Obstructive sleep apnea (adult) (pediatric): Secondary | ICD-10-CM

## 2018-01-26 DIAGNOSIS — Z720 Tobacco use: Secondary | ICD-10-CM

## 2018-01-26 DIAGNOSIS — R918 Other nonspecific abnormal finding of lung field: Secondary | ICD-10-CM

## 2018-01-26 NOTE — Patient Instructions (Signed)
Continue using cpap every night.  Will recheck a CT scan in 1 year, follow up after that.   --Quitting smoking is the most important thing that you can do for your health.  --Quitting smoking will have greater affect on your health than any medicine that we can give you.

## 2018-01-27 ENCOUNTER — Ambulatory Visit: Payer: No Typology Code available for payment source

## 2018-02-18 ENCOUNTER — Ambulatory Visit: Payer: No Typology Code available for payment source | Admitting: Radiation Oncology

## 2018-02-25 ENCOUNTER — Ambulatory Visit
Admission: RE | Admit: 2018-02-25 | Discharge: 2018-02-25 | Disposition: A | Discharge status: Home / Self Care | Payer: No Typology Code available for payment source | Source: Ambulatory Visit | Attending: Radiation Oncology | Admitting: Radiation Oncology

## 2018-02-25 ENCOUNTER — Other Ambulatory Visit: Payer: Self-pay

## 2018-02-25 ENCOUNTER — Encounter: Payer: Self-pay | Admitting: Radiation Oncology

## 2018-02-25 ENCOUNTER — Inpatient Hospital Stay: Payer: No Typology Code available for payment source | Attending: Radiation Oncology

## 2018-02-25 ENCOUNTER — Other Ambulatory Visit: Payer: Self-pay | Admitting: *Deleted

## 2018-02-25 VITALS — BP 118/72 | HR 107 | Temp 96.4°F | Resp 20 | Wt 302.5 lb

## 2018-02-25 DIAGNOSIS — C61 Malignant neoplasm of prostate: Secondary | ICD-10-CM

## 2018-02-25 DIAGNOSIS — Z923 Personal history of irradiation: Secondary | ICD-10-CM | POA: Diagnosis not present

## 2018-02-25 LAB — PSA

## 2018-02-25 NOTE — Progress Notes (Signed)
Radiation Oncology Follow up Note  Name: Evan Newton   Date:   02/25/2018 MRN:  [id number] DOB: 05/20/1956    This 62 y.o. 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  REFERRING PROVIDER: Stoney Bang,*  HPI: patient is a 62 year old male now 11 months out from treatment. He underwent try modality treatment withexternal beam radiation therapy as well as I-125 interstitial implant for boost as well as androgen deprivation therapy. He is seen today in routine follow-up is doing fairly well his most recent PSA was back in March 2019 was less than 0.1. He specifically denies urinary frequency or urgency. He does have some intermittent diarrhea and rectal urgency. He is also on continuous androgen deprivation therapy scheduled for his next shot with Dr. Erlene Quan in October..  COMPLICATIONS OF TREATMENT: none  FOLLOW UP COMPLIANCE: keeps appointments   PHYSICAL EXAM:  BP 118/72 (BP Location: Left Arm)   Pulse (!) 107   Temp (!) 96.4 F (35.8 C) (Tympanic)   Resp 20   Wt (!) 302 lb 7.5 oz (137.2 kg)   BMI 47.37 kg/m  Well-developed well-nourishedmorbidly obese patient in NAD. HEENT reveals PERLA, EOMI, discs not visualized.  Oral cavity is clear. No oral mucosal lesions are identified. Neck is clear without evidence of cervical or supraclavicular adenopathy. Lungs are clear to A&P. Cardiac examination is essentially unremarkable with regular rate and rhythm without murmur rub or thrill. Abdomen is benign with no organomegaly or masses noted. Motor sensory and DTR levels are equal and symmetric in the upper and lower extremities. Cranial nerves II through XII are grossly intact. Proprioception is intact. No peripheral adenopathy or edema is identified. No motor or sensory levels  are noted. Crude visual fields are within normal range.  RADIOLOGY RESULTS: no current films for review  PLAN: I have obtained a PSA level on him today and will report that separately. I'm otherwise please was overall progress. I've asked to see him back in 1 year for follow-up. He continues close follow-up care and androgen deprivation therapy through Dr. Cherrie Gauze office. Patient knows to call sooner with any concerns at any time.  I would like to take this opportunity to thank you for allowing me to participate in the care of your patient.Noreene Filbert, MD

## 2018-04-21 ENCOUNTER — Encounter: Payer: Self-pay | Admitting: Urology

## 2018-04-21 ENCOUNTER — Ambulatory Visit (INDEPENDENT_AMBULATORY_CARE_PROVIDER_SITE_OTHER): Payer: No Typology Code available for payment source | Admitting: Urology

## 2018-04-21 VITALS — BP 138/82 | HR 78 | Ht 67.0 in | Wt 312.4 lb

## 2018-04-21 DIAGNOSIS — C61 Malignant neoplasm of prostate: Secondary | ICD-10-CM

## 2018-04-21 DIAGNOSIS — R300 Dysuria: Secondary | ICD-10-CM

## 2018-04-21 DIAGNOSIS — R109 Unspecified abdominal pain: Secondary | ICD-10-CM | POA: Diagnosis not present

## 2018-04-21 LAB — URINALYSIS, COMPLETE
Bilirubin, UA: NEGATIVE
GLUCOSE, UA: NEGATIVE
Ketones, UA: NEGATIVE
Leukocytes, UA: NEGATIVE
NITRITE UA: NEGATIVE
Protein, UA: NEGATIVE
Specific Gravity, UA: 1.02 (ref 1.005–1.030)
UUROB: 0.2 mg/dL (ref 0.2–1.0)
pH, UA: 5.5 (ref 5.0–7.5)

## 2018-04-21 LAB — MICROSCOPIC EXAMINATION
Epithelial Cells (non renal): NONE SEEN /hpf (ref 0–10)
WBC UA: NONE SEEN /HPF (ref 0–5)

## 2018-04-21 MED ORDER — LEUPROLIDE ACETATE (6 MONTH) 45 MG IM KIT
45.0000 mg | PACK | Freq: Once | INTRAMUSCULAR | Status: AC
  Administered 2018-04-21: 45 mg via INTRAMUSCULAR

## 2018-04-21 NOTE — Progress Notes (Signed)
04/21/2018 10:14 AM   Evan Newton [birth year] [id number]  Referring provider: Stoney Bang, MD No address on file  Chief Complaint  Patient presents with  . Prostate Cancer  . Dysuria    HPI: 62 year-old male with high risk prostate cancer, stage TIIa dx 2018 s/p EBRT/ brachytherapy boost currently on ADT.  He returns today for ongoing ADT but also mentions today that he has been struggling with stone disease.  He reports today that he has had issues with right flank pain and intermittent gross hematuria diagnosed with stone several months ago.   This started relatively acutely after an episode of being stuck outside in a hot truck without air conditioning and felt like he was significantly dehydrated.  He continues to have intermittent right flank pain.     He does have a personal history of nephrolithiasis.  He was seen and evaluated at [facility] facility for this but no imaging was obtained.  He did have blood in his urine.  He does also report today that he has had intermittent dysuria since his prostate seed placement.  This comes and goes.  No exacerbating or alleviating factors.  He continues on Flomax.  UA today is unremarkable other than from the risk of blood.  He continues to have intermittent hot flashes.  He is not currently taking calcium and vitamin D as previously recommended.   Prostate cancer history: He was initially diagnosed by Dr. Bernardo Heater on 07/29/2016 With an abnormal rectal exam, PSA of 38.54.  Prostate biopsy revealed a volume of 62 cc with 3 of 12 cores positive with a maximum Gleason score 4+5 at the right apex, right mid-gland, right base.   Staging including CT abdomen and pelvis on 2/18 revealed numerous subcentimeter pulmonary nodules which are indeterminate. There is also several subsequent centimeter osseous sclerotic lesions which were negative on bone scan. He did have a posterior skull focus concerning for sclerotic metastasis but follow-up CT  head was negative. Additional imaging includes an MRI of the prostate on 09/27/2016 which showed evidence of extracapsular extension, possible right seminal vesicle invasion and a partial lesion involving the right femoral head/neck not seen on other studies.  He elected external beam radiation with brachytherapy boost as well as total androgen therapy including Lupron and Casodex started 09/2016.  He tolerated 5 weeks of external beam radiation (45 gray completed 12/19/16), however, due to general dissatisfaction with his care, he elected to transfer his care to [facility] which is more convenient for him.   PMH: Past Medical History:  Diagnosis Date  . Arthritis   . Diabetes ([place])    Type 2  . GERD (gastroesophageal reflux disease)   . History of external beam radiation therapy   . Hypertension   . Prostate cancer ([place])   . Sleep apnea    CPAP at night  . Thyroid disease   . Tobacco abuse     Surgical History: Past Surgical History:  Procedure Laterality Date  . CHOLECYSTECTOMY    . HERNIA REPAIR    . KNEE ARTHROSCOPY Right   . RADIOACTIVE SEED IMPLANT N/A 03/03/2017   Procedure: RADIOACTIVE SEED IMPLANT/BRACHYTHERAPY IMPLANT;  Surgeon: Hollice Espy, MD;  Location: [facility] ORS;  Service: Urology;  Laterality: N/A;  . SHOULDER SURGERY Left 1997   Rotator cuff  . TONSILLECTOMY      Home Medications:  Allergies as of 04/21/2018   No Known Allergies     Medication List  Accurate as of 04/21/18 10:14 AM. Always use your most recent med list.          acetaminophen 500 MG tablet Commonly known as:  TYLENOL Take 1,000 mg by mouth every 6 (six) hours as needed for mild pain or headache.   amLODipine 5 MG tablet Commonly known as:  NORVASC Take 5 mg by mouth every evening.   aspirin EC 81 MG tablet Take 81 mg by mouth daily.   atorvastatin 20 MG tablet Commonly known as:  LIPITOR Take 20 mg by mouth daily.   bicalutamide 50 MG tablet Commonly known as:   CASODEX Take 50 mg by mouth daily.   cetirizine 10 MG tablet Commonly known as:  ZYRTEC Take 10 mg by mouth daily.   fluticasone 50 MCG/ACT nasal spray Commonly known as:  FLONASE Place 1 spray into both nostrils daily.   glipiZIDE 10 MG tablet Commonly known as:  GLUCOTROL Take 10 mg by mouth 2 (two) times daily before a meal.   ketorolac 10 MG tablet Commonly known as:  TORADOL   levothyroxine 200 MCG tablet Commonly known as:  SYNTHROID, LEVOTHROID Take 200 mcg by mouth daily before breakfast.   loratadine 10 MG tablet Commonly known as:  CLARITIN Take 10 mg by mouth daily.   losartan 50 MG tablet Commonly known as:  COZAAR Take 50 mg by mouth every evening.   metFORMIN 500 MG tablet Commonly known as:  GLUCOPHAGE Take 1,000 mg by mouth 2 (two) times daily with a meal.   phentermine 37.5 MG tablet Commonly known as:  ADIPEX-P Take 1 tablet by mouth daily.   sucralfate 1 g tablet Commonly known as:  CARAFATE Take 1 tablet (1 g total) by mouth 2 (two) times daily.   tamsulosin 0.4 MG Caps capsule Commonly known as:  FLOMAX Take 1 capsule (0.4 mg total) by mouth daily.   TRULICITY 9.24 QA/8.3MH Sopn Generic drug:  Dulaglutide   valsartan 160 MG tablet Commonly known as:  DIOVAN Take 160 mg by mouth.       Allergies: No Known Allergies  Family History: Family History  Problem Relation Age of Onset  . Hypertension Mother   . Diabetes Father   . Hypertension Father   . Kidney cancer Neg Hx   . Prostate cancer Neg Hx   . Bladder Cancer Neg Hx     Social History:  reports that he has been smoking cigarettes. He has a 40.00 pack-year smoking history. He has never used smokeless tobacco. He reports that he does not drink alcohol or use drugs.  ROS: UROLOGY Frequent Urination?: Yes Hard to postpone urination?: Yes Burning/pain with urination?: Yes Get up at night to urinate?: Yes Leakage of urine?: No Urine stream starts and stops?: Yes Trouble  starting stream?: No Do you have to strain to urinate?: No Blood in urine?: No Urinary tract infection?: No Sexually transmitted disease?: No Injury to kidneys or bladder?: No Painful intercourse?: No Weak stream?: No Erection problems?: No Penile pain?: No  Gastrointestinal Nausea?: Yes Vomiting?: Yes Indigestion/heartburn?: Yes Diarrhea?: Yes Constipation?: No  Constitutional Fever: No Night sweats?: No Weight loss?: No Fatigue?: Yes  Skin Skin rash/lesions?: No Itching?: No  Eyes Blurred vision?: No Double vision?: No  Ears/Nose/Throat Sore throat?: No Sinus problems?: No  Hematologic/Lymphatic Swollen glands?: No Easy bruising?: No  Cardiovascular Leg swelling?: No Chest pain?: No  Respiratory Cough?: No Shortness of breath?: No  Endocrine Excessive thirst?: No  Musculoskeletal Back pain?: Yes Joint pain?:  Yes  Neurological Headaches?: No Dizziness?: No  Psychologic Depression?: No Anxiety?: No  Physical Exam: BP 138/82 (BP Location: Left Arm, Patient Position: Sitting, Cuff Size: Large)   Pulse 78   Ht 5\' 7"  (1.702 m)   Wt (!) 312 lb 6.4 oz (141.7 kg)   BMI 48.93 kg/m   Constitutional:  Alert and oriented, No acute distress. HEENT: [place] AT, moist mucus membranes.  Trachea midline, no masses. Cardiovascular: No clubbing, cyanosis, or edema. Respiratory: Normal respiratory effort, no increased work of breathing. GI: Abdomen is soft, nontender, nondistended, no abdominal masses, obese Skin: No rashes, bruises or suspicious lesions. Neurologic: Grossly intact, no focal deficits, moving all 4 extremities. Psychiatric: Normal mood and affect.  Laboratory Data: Lab Results  Component Value Date   WBC 16.5 (H) 11/12/2017   HGB 15.8 11/12/2017   HCT 47.4 11/12/2017   MCV 94.0 11/12/2017   PLT 300 11/12/2017    Lab Results  Component Value Date   CREATININE 0.98 11/12/2017   Most recent PSA less than 0.01 on  02/25/2018  Urinalysis UA today reviewed, 3-10 red blood cells per high-power field, otherwise unremarkable.  Pertinent Imaging: No new recent interval imaging  Assessment & Plan:    1. Prostate cancer [facility]) High-risk prostate cancer currently on lupron x 2-3 years Status post external beam radiation with brachytherapy boost complete on 02/2017 Due for lupron today, 6 mo depo given Reviewed calcium and vitamin D recommendations again today, handout given Encouraged weightbearing exercise - Leuprolide Acetate (6 Month) (LUPRON) injection 45 mg  2. Dysuria No evidence of UTI Likely sequela of see placement Encourage adequate hydration Continue Flomax - Urinalysis, Complete  3. Right flank pain Intermittent episodes of right flank pain, no for almost 2 months I recommended pursuing a noncontrast CT scan to assess for size and location of stones if present I will call him with these results - CT RENAL STONE STUDY; Future  Return in about 6 months (around 10/21/2018) for 6 Month Lupron Injection .  Hollice Espy, MD  [facility] [address], [place], [place] [address] [phone]

## 2018-04-21 NOTE — Patient Instructions (Signed)
Discussed importance of bone health on ADT, recommend 1000-1200 mg daily calcium suppliment and 800-1000 IU vit D daily.  Also encouraged weight being exercises and cardiovascular health.  

## 2018-04-27 ENCOUNTER — Ambulatory Visit
Admission: RE | Admit: 2018-04-27 | Discharge: 2018-04-27 | Disposition: A | Discharge status: Home / Self Care | Payer: No Typology Code available for payment source | Source: Ambulatory Visit | Attending: Urology | Admitting: Urology

## 2018-04-27 DIAGNOSIS — N200 Calculus of kidney: Secondary | ICD-10-CM | POA: Diagnosis not present

## 2018-04-27 DIAGNOSIS — K5730 Diverticulosis of large intestine without perforation or abscess without bleeding: Secondary | ICD-10-CM | POA: Diagnosis not present

## 2018-04-27 DIAGNOSIS — K760 Fatty (change of) liver, not elsewhere classified: Secondary | ICD-10-CM | POA: Insufficient documentation

## 2018-04-27 DIAGNOSIS — D3501 Benign neoplasm of right adrenal gland: Secondary | ICD-10-CM | POA: Insufficient documentation

## 2018-04-27 DIAGNOSIS — R109 Unspecified abdominal pain: Secondary | ICD-10-CM | POA: Diagnosis not present

## 2018-04-28 ENCOUNTER — Telehealth: Payer: Self-pay | Admitting: Urology

## 2018-04-28 DIAGNOSIS — N281 Cyst of kidney, acquired: Secondary | ICD-10-CM

## 2018-04-28 NOTE — Telephone Encounter (Signed)
Call patient to review CT scan results.  He has bilateral nonobstructing stones measuring up to 9 mm on the left greater than right.  He has no obstructing ureteral calculi.  He does have multiple bilateral renal cysts which are unable to be characterized.  Follow-up MR of the abdomen was recommended.  Patient is agreeable to pursuing MRI.  This was ordered today.  I would like him to follow-up in 4 weeks with MRI prior.  We will also discuss with her not to intervene on his nonobstructing stones.  All questions were answered.

## 2018-05-24 ENCOUNTER — Telehealth: Payer: Self-pay | Admitting: Urology

## 2018-05-24 DIAGNOSIS — N281 Cyst of kidney, acquired: Secondary | ICD-10-CM

## 2018-05-24 NOTE — Telephone Encounter (Signed)
The MRI dept in [place] needs orders for creatinine for this patient for his MRI tomorrow please   Thanks, Sharyn Lull

## 2018-05-25 ENCOUNTER — Ambulatory Visit
Admission: RE | Admit: 2018-05-25 | Discharge: 2018-05-25 | Disposition: A | Discharge status: Home / Self Care | Payer: No Typology Code available for payment source | Source: Ambulatory Visit | Attending: Urology | Admitting: Urology

## 2018-05-25 ENCOUNTER — Other Ambulatory Visit
Admission: RE | Admit: 2018-05-25 | Discharge: 2018-05-25 | Disposition: A | Discharge status: Home / Self Care | Payer: No Typology Code available for payment source | Source: Ambulatory Visit | Attending: Urology | Admitting: Urology

## 2018-05-25 DIAGNOSIS — D3501 Benign neoplasm of right adrenal gland: Secondary | ICD-10-CM | POA: Insufficient documentation

## 2018-05-25 DIAGNOSIS — K760 Fatty (change of) liver, not elsewhere classified: Secondary | ICD-10-CM | POA: Insufficient documentation

## 2018-05-25 DIAGNOSIS — D1771 Benign lipomatous neoplasm of kidney: Secondary | ICD-10-CM | POA: Diagnosis not present

## 2018-05-25 DIAGNOSIS — N281 Cyst of kidney, acquired: Secondary | ICD-10-CM | POA: Insufficient documentation

## 2018-05-25 DIAGNOSIS — C61 Malignant neoplasm of prostate: Secondary | ICD-10-CM

## 2018-05-25 LAB — CREATININE, SERUM: Creatinine, Ser: 1.06 mg/dL (ref 0.61–1.24)

## 2018-05-25 LAB — PSA

## 2018-05-25 LAB — BUN: BUN: 17 mg/dL (ref 6–20)

## 2018-05-25 MED ORDER — GADOBUTROL 1 MMOL/ML IV SOLN
10.0000 mL | Freq: Once | INTRAVENOUS | Status: AC | PRN
  Administered 2018-05-25: 10 mL via INTRAVENOUS

## 2018-10-26 ENCOUNTER — Ambulatory Visit: Payer: No Typology Code available for payment source | Admitting: Urology

## 2018-10-27 ENCOUNTER — Other Ambulatory Visit: Payer: Self-pay

## 2018-10-27 ENCOUNTER — Encounter: Payer: Self-pay | Admitting: Urology

## 2018-10-27 ENCOUNTER — Ambulatory Visit (INDEPENDENT_AMBULATORY_CARE_PROVIDER_SITE_OTHER): Payer: No Typology Code available for payment source | Admitting: Urology

## 2018-10-27 VITALS — BP 143/85 | HR 94 | Ht 67.0 in | Wt 320.0 lb

## 2018-10-27 DIAGNOSIS — N200 Calculus of kidney: Secondary | ICD-10-CM

## 2018-10-27 DIAGNOSIS — C61 Malignant neoplasm of prostate: Secondary | ICD-10-CM | POA: Diagnosis not present

## 2018-10-27 MED ORDER — LEUPROLIDE ACETATE (6 MONTH) 45 MG IM KIT
45.0000 mg | PACK | Freq: Once | INTRAMUSCULAR | Status: AC
  Administered 2018-10-27: 45 mg via INTRAMUSCULAR

## 2018-10-27 NOTE — Progress Notes (Signed)
Lupron IM Injection   Due to Prostate Cancer patient is present today for a Lupron Injection.  Medication: Lupron 6 month Dose: 45 mg  Location: left upper outer buttocks Lot: [id number] Exp: 11/17/2020  Patient tolerated well, no complications were noted  Performed by: Verlene Mayer, CMA   Follow up: 6 month

## 2018-10-27 NOTE — Progress Notes (Signed)
10/27/2018 2:42 PM   Evan Newton [birth year] [id number]  Referring provider: Wilhemina Bonito, MD [address] [place], [place] [address]  Chief Complaint  Patient presents with  . Prostate Cancer    53month    HPI: 63 year old male with personal history of high risk prostate cancer, kidney stones who returns the office for routine 23-month follow-up.  Since last visit, he believes he passed a few kidney stones.  He is not having any current flank pain.  His last CT scan was 04/2018 showing multiple bilateral stones up to 9 mm.  He stopped his calcium 7 patient is a think that these are contributing to his stones.  He reports today that he has been struggling with his blood sugars.  He is also been struggling with lethargy and fatigue.  He is not motivated to lose weight.  Most recent PSA was 0.01 on 05/2018.  He is due for Lupron today.  Prostate cancer history: He was initially diagnosed by Dr.Stoioffon 01/16/2018With an abnormal rectal exam, PSA of 38.54.Prostate biopsy revealed a volume of 62 cc with 3 of 12 cores positive with a maximum Gleason score 4+5 at the right apex, right mid-gland, right base.   Staging including CT abdomen and pelvis on 2/18 revealed numerous subcentimeter pulmonary nodules which are indeterminate. There is also several subsequent centimeter osseous sclerotic lesions which were negative on bone scan. He did have a posterior skull focus concerning for sclerotic metastasis but follow-up CT head was negative. Additional imaging includes an MRI of the prostate on 09/27/2016 which showed evidence of extracapsular extension, possible right seminal vesicle invasion and a partial lesion involving the right femoral head/neck not seen on other studies.  He elected external beam radiation with brachytherapy boost as well as total androgen therapy including Lupron and Casodexstarted 09/2016.He tolerated 5 weeks of external beam radiation(45 graycompleted  12/19/16), however, due to general dissatisfaction with his care, he elected to transfer his care to [facility] which is more convenient for him.   PMH: Past Medical History:  Diagnosis Date  . Arthritis   . Diabetes ([place])    Type 2  . GERD (gastroesophageal reflux disease)   . History of external beam radiation therapy   . Hypertension   . Prostate cancer ([place])   . Sleep apnea    CPAP at night  . Thyroid disease   . Tobacco abuse     Surgical History: Past Surgical History:  Procedure Laterality Date  . CHOLECYSTECTOMY    . HERNIA REPAIR    . KNEE ARTHROSCOPY Right   . RADIOACTIVE SEED IMPLANT N/A 03/03/2017   Procedure: RADIOACTIVE SEED IMPLANT/BRACHYTHERAPY IMPLANT;  Surgeon: Hollice Espy, MD;  Location: [facility] ORS;  Service: Urology;  Laterality: N/A;  . SHOULDER SURGERY Left 1997   Rotator cuff  . TONSILLECTOMY      Home Medications:  Allergies as of 10/27/2018   No Known Allergies     Medication List       Accurate as of October 27, 2018  2:42 PM. Always use your most recent med list.        acetaminophen 500 MG tablet Commonly known as:  TYLENOL Take 1,000 mg by mouth every 6 (six) hours as needed for mild pain or headache.   amLODipine 5 MG tablet Commonly known as:  NORVASC Take 5 mg by mouth every evening.   aspirin EC 81 MG tablet Take 81 mg by mouth daily.   atorvastatin 20 MG tablet Commonly known as:  LIPITOR Take 20 mg by mouth daily.   cetirizine 10 MG tablet Commonly known as:  ZYRTEC Take 10 mg by mouth daily.   fluticasone 50 MCG/ACT nasal spray Commonly known as:  FLONASE Place 1 spray into both nostrils daily.   glipiZIDE 10 MG tablet Commonly known as:  GLUCOTROL Take 10 mg by mouth 2 (two) times daily before a meal.   ketorolac 10 MG tablet Commonly known as:  TORADOL   levothyroxine 200 MCG tablet Commonly known as:  SYNTHROID, LEVOTHROID Take 200 mcg by mouth daily before breakfast.   loratadine 10 MG tablet Commonly  known as:  CLARITIN Take 10 mg by mouth daily.   losartan 50 MG tablet Commonly known as:  COZAAR Take 50 mg by mouth every evening.   metFORMIN 500 MG tablet Commonly known as:  GLUCOPHAGE Take 1,000 mg by mouth 2 (two) times daily with a meal.   phentermine 37.5 MG tablet Commonly known as:  ADIPEX-P Take 1 tablet by mouth daily.   sucralfate 1 g tablet Commonly known as:  Carafate Take 1 tablet (1 g total) by mouth 2 (two) times daily.   tamsulosin 0.4 MG Caps capsule Commonly known as:  FLOMAX Take 1 capsule (0.4 mg total) by mouth daily.   Trulicity 6.43 PI/9.5JO Sopn Generic drug:  Dulaglutide   valsartan 160 MG tablet Commonly known as:  DIOVAN Take 160 mg by mouth.       Allergies: No Known Allergies  Family History: Family History  Problem Relation Age of Onset  . Hypertension Mother   . Diabetes Father   . Hypertension Father   . Kidney cancer Neg Hx   . Prostate cancer Neg Hx   . Bladder Cancer Neg Hx     Social History:  reports that he has been smoking cigarettes. He has a 40.00 pack-year smoking history. He has never used smokeless tobacco. He reports that he does not drink alcohol or use drugs.  ROS: UROLOGY Frequent Urination?: Yes Hard to postpone urination?: Yes Burning/pain with urination?: Yes Get up at night to urinate?: Yes Leakage of urine?: No Urine stream starts and stops?: No Trouble starting stream?: No Do you have to strain to urinate?: No Blood in urine?: No Urinary tract infection?: No Sexually transmitted disease?: No Injury to kidneys or bladder?: No Painful intercourse?: No Weak stream?: No Erection problems?: No Penile pain?: No  Gastrointestinal Nausea?: Yes Vomiting?: No Indigestion/heartburn?: No Diarrhea?: Yes Constipation?: No  Constitutional Fever: No Night sweats?: Yes Weight loss?: No Fatigue?: Yes  Skin Skin rash/lesions?: No Itching?: No  Eyes Blurred vision?: No Double vision?: No   Ears/Nose/Throat Sore throat?: No Sinus problems?: No  Hematologic/Lymphatic Swollen glands?: No Easy bruising?: No  Cardiovascular Leg swelling?: No Chest pain?: No  Respiratory Cough?: No Shortness of breath?: No  Endocrine Excessive thirst?: No  Musculoskeletal Back pain?: No Joint pain?: No  Neurological Headaches?: No Dizziness?: No  Psychologic Depression?: No Anxiety?: No  Physical Exam: BP (!) 143/85   Pulse 94   Ht 5\' 7"  (1.702 m)   Wt (!) 320 lb (145.2 kg)   BMI 50.12 kg/m   Constitutional:  Alert and oriented, No acute distress. HEENT: [place] AT, moist mucus membranes.  Trachea midline, no masses.  Wearing nasal strip. Cardiovascular: No clubbing, cyanosis, or edema. Respiratory: Normal respiratory effort, no increased work of breathing. GI: Abdomen is soft, nontender, nondistended, no abdominal masses, morbidly obese.. Skin: No rashes, bruises or suspicious lesions. Neurologic: Grossly intact, no  focal deficits, moving all 4 extremities. Psychiatric: Normal mood and affect.  Laboratory Data: PSA today pending  Assessment & Plan:    1. Prostate cancer [facility]) High-risk prostate cancer currently on lupronx 2-3 years (strongly encouraged 3 years given PSA/ high volume diease) Status post external beam radiation with brachytherapy boost complete on 02/2017 Due for luprontoday, 6 mo depo given PSA pending today Continue to encourage physical activity and weightbearing exercise - PSA - PSA; Future  2. Kidney stones Lateral kidney stones, currently asymptomatic Given the size and number of his stones, would possibly consider prophylactic intervention for the stones however the patient is not interested in pursuing this at this time Reviewed stone diet  3. Morbid obesity ([place]) Strongly encourage weight loss today, discussed possible exercise regimens to help aid in this We discussed this will improve his overall health, diabetes, energy level, mood,  etc.  Return in about 6 months (around 04/28/2019) for PSA prior .  Hollice Espy, MD  [facility] [address] [place], [place] [address] [phone]

## 2018-10-28 ENCOUNTER — Telehealth: Payer: Self-pay

## 2018-10-28 LAB — PSA: Prostate Specific Ag, Serum: <0.1 ng/mL (ref 0.0–4.0)

## 2018-10-28 NOTE — Telephone Encounter (Signed)
-----   Message from Hollice Espy, MD sent at 10/28/2018  8:35 AM EDT ----- PSA is undetectable.    Hollice Espy, MD

## 2018-10-28 NOTE — Telephone Encounter (Signed)
.  mychart

## 2019-01-11 ENCOUNTER — Telehealth: Payer: Self-pay | Admitting: Internal Medicine

## 2019-01-11 NOTE — Telephone Encounter (Signed)
Pt called to let us know that he'd like to cancel his order/appt. For the CT that Dr. Juanell Fairly sent in. Pt stated that he lost his job and will not have the funds for this and would not like any one to call him to try and schedule because he doesn't know where he'll be.

## 2019-01-11 NOTE — Telephone Encounter (Signed)
CT Chest has been canceled per patient request. Evan Newton

## 2019-01-12 ENCOUNTER — Telehealth: Payer: Self-pay | Admitting: Internal Medicine

## 2019-01-12 NOTE — Telephone Encounter (Signed)
Please see 01/11/2019 phone note.  CT has been canceled. Pt is aware and stated that nothing further was needed at this time.

## 2019-01-28 ENCOUNTER — Encounter: Payer: Self-pay | Admitting: *Deleted

## 2019-03-03 ENCOUNTER — Ambulatory Visit: Payer: No Typology Code available for payment source | Admitting: Radiation Oncology

## 2019-03-07 ENCOUNTER — Encounter: Payer: Self-pay | Admitting: Radiation Oncology

## 2019-03-24 ENCOUNTER — Ambulatory Visit: Payer: No Typology Code available for payment source | Admitting: Radiation Oncology

## 2019-04-22 ENCOUNTER — Other Ambulatory Visit: Payer: No Typology Code available for payment source

## 2019-04-27 ENCOUNTER — Ambulatory Visit: Payer: No Typology Code available for payment source | Admitting: Urology

## 2019-05-24 ENCOUNTER — Telehealth: Payer: Self-pay

## 2019-05-24 NOTE — Telephone Encounter (Signed)
Contacted PAP for [place] to see if patient is able to enter the program. Patient is over due for his injection and needs to be scheduled

## 2019-06-14 NOTE — Telephone Encounter (Signed)
Spoke with patient and notified him that we had started the PAP process and would need him to sign the paperwork and Attach W2, 1099, and 1040 tax income info from 2019. Patient is out of town so he requested forms be mailed to him and he will send back with information

## 2021-03-06 NOTE — Nursing Note (Signed)
Adult Admission Assessment - Text       Perioperative Admission Assessment Entered On:  03/06/2021 12:35 EDT    Performed On:  03/06/2021 12:35 EDT by Marvis Repress A-RN               General   Call Start :   03/06/2021 12:38 EDT   Call Complete :   03/06/2021 13:10 EDT   Information Given By :   Self   Height/Length Estimated :   170 cm(Converted to: 66.93 in)    Weight   Estimated :   136 kg(Converted to: 299.829 lb)    Body Mass Index Estimated :   47.06 kg/m2   PAT Patient Procedure Verification :   Patient name and DOB confirmed with patient, Correct procedure scheduled confirmed with patient, Correct side/site confirmed with patient   Day of Proc Supp Prsn is the Emerg Cont :   No   PAT Patient/Procedure Verification :   Brady Castro   Emergency Contact Phone :   [phone]   Emergency Contact Relationship :   DAUGHTER   Languages :   English   Primary Care Physician/Specialists :   DR. Sandria Manly  DR. COOK-PCP  DR.JETER-ONC   Marvis Repress A-RN - 03/06/2021 12:38 EDT     Allergies   (As Of: 03/14/2021 12:34:54 EDT)   Allergies (Active)   No Known Allergies  Estimated Onset Date:   Unspecified ; Created By:   Marvis Repress A-RN; Reaction Status:   Active ; Category:   Drug ; Substance:   No Known Allergies ; Type:   Allergy ; Updated By:   Adele Barthel; Reviewed Date:   03/14/2021 12:32 EDT        Medication History   Medication List   (As Of: 03/14/2021 12:34:54 EDT)   Normal Order    Lactated Ringers Injection solution 1,000 mL  :   Lactated Ringers Injection solution 1,000 mL ; Status:   Ordered ; Ordered As Mnemonic:   Lactated Ringers Injection 1,000 mL ; Simple Display Line:   30 mL/hr, IV ; Ordering Provider:   Leanne Lovely SCOTT-MD; Catalog Code:   Lactated Ringers Injection ; Order Dt/Tm:   03/14/2021 12:16:24 EDT ; Comment:   for patients that DO NOT have Serum Creatinine > 2.'5mg'$ /dL, End-Stage Renal Disease, Hemodialysis          lidocaine 1% PF Inj Soln 2 mL  :   lidocaine 1% PF Inj Soln 2 mL ;  Status:   Ordered ; Ordered As Mnemonic:   lidocaine 1% preservative-free injectable solution ; Simple Display Line:   0.25 mL, ID, q56mn, PRN: other (see comment) ; Ordering Provider:   WLeanne LovelySCOTT-MD; Catalog Code:   lidocaine ; Order Dt/Tm:   03/14/2021 12:16:24 EDT ; Comment:   to access lidocaine 1%  2 mL vial for IV start and Life Port access          sodium chloride 0.9% Inj Soln 10 mL syringe  :   sodium chloride 0.9% Inj Soln 10 mL syringe ; Status:   Ordered ; Ordered As Mnemonic:   10 mL sodium chloride 0.9% flush syringe range dose ; Simple Display Line:   30 mL, IV Push, q556m, PRN: other (see comment) ; Ordering Provider:   WILeanne LovelyCOTT-MD; Catalog Code:   sodium chloride flush ; Order Dt/Tm:   03/14/2021 12:16:24 EDT  Home Meds    acetaminophen  :   acetaminophen ; Status:   Documented ; Ordered As Mnemonic:   Tylenol ; Simple Display Line:   1,000 mg, Oral, q6hr, PRN: mild pain (1-3), 0 Refill(s) ; Catalog Code:   acetaminophen ; Order Dt/Tm:   03/06/2021 12:46:38 EDT          phenazopyridine  :   phenazopyridine ; Status:   Documented ; Ordered As Mnemonic:   phenazopyridine 200 mg oral tablet ; Simple Display Line:   200 mg, 1 tabs, Oral, TID, for 7 days, 21 tabs, 0 Refill(s) ; Catalog Code:   phenazopyridine ; Order Dt/Tm:   03/06/2021 12:46:09 EDT          cefdinir  :   cefdinir ; Status:   Documented ; Ordered As Mnemonic:   cefdinir 300 mg oral capsule ; Simple Display Line:   300 mg, 1 caps, Oral, q12hr, for 14 days, 28 caps, 0 Refill(s) ; Catalog Code:   cefdinir ; Order Dt/Tm:   03/06/2021 12:45:40 EDT          potassium chloride  :   potassium chloride ; Status:   Documented ; Ordered As Mnemonic:   potassium chloride 15 mEq oral tablet, extended release ; Simple Display Line:   15 mEq, 1 tabs, Oral, BID, 100 tabs, 0 Refill(s) ; Catalog Code:   potassium chloride ; Order Dt/Tm:   03/06/2021 12:45:09 EDT          amLODIPine  :   amLODIPine ; Status:   Documented ;  Ordered As Mnemonic:   amLODIPine 10 mg oral tablet ; Simple Display Line:   10 mg, 1 tabs, Oral, At Bedtime (Once a Day), 0 Refill(s) ; Catalog Code:   amLODIPine ; Order Dt/Tm:   03/06/2021 12:44:31 EDT          levothyroxine  :   levothyroxine ; Status:   Documented ; Ordered As Mnemonic:   levothyroxine 100 mcg (0.1 mg) oral tablet ; Simple Display Line:   200 mcg, 2 tabs, Oral, 90 tabs, 0 Refill(s) ; Catalog Code:   levothyroxine ; Order Dt/Tm:   03/06/2021 12:44:12 EDT          losartan  :   losartan ; Status:   Documented ; Ordered As Mnemonic:   losartan 50 mg oral tablet ; Simple Display Line:   50 mg, 1 tabs, Oral, At Bedtime (Once a Day), 30 tabs, 0 Refill(s) ; Catalog Code:   losartan ; Order Dt/Tm:   03/06/2021 12:44:43 EDT          atorvastatin  :   atorvastatin ; Status:   Documented ; Ordered As Mnemonic:   atorvastatin 20 mg oral tablet ; Simple Display Line:   20 mg, 1 tabs, Oral, At Bedtime (Once a Day), 0 Refill(s) ; Catalog Code:   atorvastatin ; Order Dt/Tm:   03/06/2021 12:43:10 EDT          loratadine  :   loratadine ; Status:   Documented ; Ordered As Mnemonic:   loratadine 10 mg oral tablet ; Simple Display Line:   10 mg, 1 tabs, Oral, qAM, for 7 days, 7 tabs, 0 Refill(s) ; Catalog Code:   loratadine ; Order Dt/Tm:   03/06/2021 12:43:30 EDT          glipiZIDE  :   glipiZIDE ; Status:   Documented ; Ordered As Mnemonic:   glipiZIDE ; Simple Display Line:   2  mg, Oral, BID, 0 Refill(s) ; Catalog Code:   glipiZIDE ; Order Dt/Tm:   03/06/2021 12:42:54 EDT          metFORMIN  :   metFORMIN ; Status:   Documented ; Ordered As Mnemonic:   metFORMIN ; Simple Display Line:   1,000 mg, Oral, BID, 0 Refill(s) ; Catalog Code:   metFORMIN ; Order Dt/Tm:   03/06/2021 12:42:39 EDT            Problem History   (As Of: 03/06/2021 13:10:19 EDT)   Problems(Active)    Anxiety (SNOMED CT  :63016010 )  Name of Problem:   Anxiety ; Recorder:   Marvis Repress A-RN; Confirmation:   Confirmed ; Classification:   Patient  Stated ; Code:   [id number] ; Contributor System:   PowerChart ; Last Updated:   03/06/2021 12:56 EDT ; Life Cycle Date:   03/06/2021 ; Life Cycle Status:   Active ; Vocabulary:   SNOMED CT        Arthritis (SNOMED CT  :2202542 )  Name of Problem:   Arthritis ; Recorder:   Marvis Repress A-RN; Confirmation:   Confirmed ; Classification:   Patient Stated ; Code:   [id number] ; Contributor System:   PowerChart ; Last Updated:   03/06/2021 12:55 EDT ; Life Cycle Status:   Active ; Vocabulary:   SNOMED CT   ; Comments:        03/06/2021 12:55 - Marvis Repress A-RN  KNEES      BMI 45.0-49.9, adult (SNOMED CT  :2831517616 )  Name of Problem:   BMI 45.0-49.9, adult ; Recorder:   Marvis Repress A-RN; Confirmation:   Confirmed ; Classification:   Patient Stated ; Code:   [id number] ; Contributor System:   Conservation officer, nature ; Last Updated:   03/06/2021 12:54 EDT ; Life Cycle Date:   03/06/2021 ; Life Cycle Status:   Active ; Vocabulary:   SNOMED CT        Diabetes mellitus (SNOMED CT  :485462703 )  Name of Problem:   Diabetes mellitus ; Recorder:   Marvis Repress A-RN; Confirmation:   Confirmed ; Classification:   Patient Stated ; Code:   [id number] ; Contributor System:   PowerChart ; Last Updated:   03/06/2021 12:51 EDT ; Life Cycle Date:   03/06/2021 ; Life Cycle Status:   Active ; Vocabulary:   SNOMED CT        High cholesterol (SNOMED CT  :99371696 )  Name of Problem:   High cholesterol ; Recorder:   Ninness,  Dora A-RN; Confirmation:   Confirmed ; Classification:   Patient Stated ; Code:   [id number] ; Contributor System:   Conservation officer, nature ; Last Updated:   03/06/2021 12:51 EDT ; Life Cycle Date:   03/06/2021 ; Life Cycle Status:   Active ; Vocabulary:   SNOMED CT        History of 2019 novel coronavirus disease (COVID-19) (SNOMED CT  :7510258527 )  Name of Problem:   History of 2019 novel coronavirus disease (COVID-19) ; Onset Date:   08/2020 ; Recorder:   Marvis Repress A-RN; Confirmation:   Confirmed ; Classification:   Patient Stated ; Code:    [id number] ; Contributor System:   PowerChart ; Last Updated:   03/06/2021 12:57 EDT ; Life Cycle Status:   Active ; Vocabulary:   SNOMED CT        History of prostate cancer (SNOMED CT  :4431540086 )  Name of Problem:   History of prostate cancer ; Recorder:   Ninness,  Dora A-RN; Confirmation:   Confirmed ; Classification:   Patient Stated ; Code:   [id number] ; Contributor System:   Conservation officer, nature ; Last Updated:   03/06/2021 12:56 EDT ; Life Cycle Date:   03/06/2021 ; Life Cycle Status:   Active ; Vocabulary:   SNOMED CT        History of sepsis (SNOMED CT  :NH:5596847 )  Name of Problem:   History of sepsis ; Recorder:   Marvis Repress A-RN; Confirmation:   Confirmed ; Classification:   Patient Stated ; Code:   [id number]:5596847 ; Contributor System:   PowerChart ; Last Updated:   03/06/2021 12:54 EDT ; Life Cycle Status:   Active ; Vocabulary:   SNOMED CT   ; Comments:        03/06/2021 12:54 - Marvis Repress A-RN  PT ADMITTED WITH URINARY SEPSIS ON 02-19-21.      HTN (hypertension) (SNOMED CT  :ST:3543186 )  Name of Problem:   HTN (hypertension) ; Recorder:   Marvis Repress A-RN; Confirmation:   Confirmed ; Classification:   Patient Stated ; Code:   [id number] ; Contributor System:   Conservation officer, nature ; Last Updated:   03/06/2021 12:51 EDT ; Life Cycle Date:   03/06/2021 ; Life Cycle Status:   Active ; Vocabulary:   SNOMED CT        Hypothyroidism (SNOMED CT  :DP:2478849 )  Name of Problem:   Hypothyroidism ; Recorder:   Marvis Repress A-RN; Confirmation:   Confirmed ; Classification:   Patient Stated ; Code:   [id number] ; Contributor System:   Conservation officer, nature ; Last Updated:   03/06/2021 12:50 EDT ; Life Cycle Date:   03/06/2021 ; Life Cycle Status:   Active ; Vocabulary:   SNOMED CT        Kidney stone on left side (SNOMED CT  :NH:5592861 )  Name of Problem:   Kidney stone on left side ; Recorder:   Ninness,  Dora A-RN; Confirmation:   Confirmed ; Classification:   Patient Stated ; Code:   [id number]:5592861 ; Contributor System:   Conservation officer, nature ; Last  Updated:   03/06/2021 12:36 EDT ; Life Cycle Date:   03/06/2021 ; Life Cycle Status:   Active ; Vocabulary:   SNOMED CT        OSA on CPAP (SNOMED CT  :JM:3464729 )  Name of Problem:   OSA on CPAP ; Recorder:   Ninness,  Dora A-RN; Confirmation:   Confirmed ; Classification:   Patient Stated ; Code:   [id number]:3464729 ; Contributor System:   PowerChart ; Last Updated:   03/06/2021 12:54 EDT ; Life Cycle Date:   03/06/2021 ; Life Cycle Status:   Active ; Vocabulary:   SNOMED CT        Seasonal allergies (SNOMED CT  :HZ:1699721 )  Name of Problem:   Seasonal allergies ; Recorder:   Marvis Repress A-RN; Confirmation:   Confirmed ; Classification:   Patient Stated ; Code:   [id number]:1699721 ; Contributor System:   Conservation officer, nature ; Last Updated:   03/06/2021 12:55 EDT ; Life Cycle Date:   03/06/2021 ; Life Cycle Status:   Active ; Vocabulary:   SNOMED CT        Wears glasses (SNOMED CT  :ZT:9180700 )  Name of Problem:   Wears glasses ; Recorder:   Marvis Repress A-RN; Confirmation:   Confirmed ;  Classification:   Patient Stated ; Code:   [id number] ; Contributor System:   Conservation officer, nature ; Last Updated:   03/06/2021 12:55 EDT ; Life Cycle Date:   03/06/2021 ; Life Cycle Status:   Active ; Vocabulary:   SNOMED CT          Procedure History        -    Procedure History   (As Of: 03/06/2021 13:10:19 EDT)     Procedure Dt/Tm:   2012 ; Anesthesia Minutes:   0 ; Procedure Name:   RIGHT KNEE ARHTROSCOPY- 2 ; Procedure Minutes:   0 ; Comments:     03/06/2021 12:59 EDT - Marvis Repress A-RN  1997 AND 2012 ; Last Reviewed Dt/Tm:   03/06/2021 13:01:16 EDT            Procedure Dt/Tm:   [id number] ; Anesthesia Minutes:   0 ; Procedure Name:   LEFT RTC REPAIR ; Procedure Minutes:   0 ; Last Reviewed Dt/Tm:   03/06/2021 13:01:16 EDT            Procedure Dt/Tm:   2002 ; Anesthesia Minutes:   0 ; Procedure Name:   LAP CHOLE ; Procedure Minutes:   0 ; Last Reviewed Dt/Tm:   03/06/2021 13:01:16 EDT            Procedure Dt/Tm:   [id number] ; Anesthesia Minutes:   0 ; Procedure Name:    THYROIDECTOMY WITH TONSILLECTOMY ; Procedure Minutes:   0 ; Last Reviewed Dt/Tm:   03/06/2021 13:01:16 EDT            Procedure Dt/Tm:   2017 ; Anesthesia Minutes:   0 ; Procedure Name:   RADIATION IMPLANT OF PROSTATE ; Procedure Minutes:   0 ; Last Reviewed Dt/Tm:   03/06/2021 13:01:16 EDT            Procedure Dt/Tm:   02/19/2021 ; Anesthesia Minutes:   0 ; Procedure Name:   LITHOTRIPSY-RIGHT AND TUBE REMOVED FROM BACK ; Procedure Minutes:   0 ; Last Reviewed Dt/Tm:   03/06/2021 13:01:16 EDT            Procedure Dt/Tm:   2001 ; Anesthesia Minutes:   0 ; Procedure Name:   COLONOSCOPY ; Procedure Minutes:   0 ; Last Reviewed Dt/Tm:   03/06/2021 13:01:16 EDT            Procedure Dt/Tm:   [id number] ; Anesthesia Minutes:   0 ; Procedure Name:   Alba ; Procedure Minutes:   0 ; Last Reviewed Dt/Tm:   03/06/2021 13:01:16 EDT            Anesthesia/Sedation   Anesthesia History :   Prior general anesthesia   SN - Malignant Hyperthermia :   Denies   Previous Problem with Anesthesia :   None   Moderate Sedation History :   Prior sedation for procedure   Previous Problem With Sedation :   None   Symptoms of Sleep Apnea :   Age greater than 50, BMI greater than 35, Frequent daytime fatigue, Hypertension, Male Gender   Symptoms of Sleep Apnea Score (STOP BANG) :   5    Shortness of Breath Indicator :   Shortness of breath with exercise   Pregnancy Status :   N/A   Marvis Repress A-RN - 03/06/2021 12:38 EDT   Bloodless Medicine   Is Blood Transfusion Acceptable to Patient :   Yes  Marvis Repress A-RN - 03/06/2021 12:38 EDT   ID Risk Screen Symptoms   Recent Travel History :   No recent travel   TB Symptom Screen :   No symptoms   Last 90 days COVID-19 ID :   No   Close Contact with COVID-19 ID :   No   Last 14 days COVID-19 ID :   No   C. diff Symptom/History ID :   Neither of the above   Marvis Repress A-RN - 03/06/2021 12:38 EDT   Social History   Social History   (As Of: 03/06/2021 13:10:19 EDT)   Tobacco:        Tobacco use: 10  or more cigarettes (1/2 pack or more)/day in last 30 days.  Cigarettes   Comments:  03/06/2021 13:02 - Marvis Repress A-RN: 1 1/2 PPD   (Last Updated: 03/06/2021 13:02:39 EDT by Marvis Repress A-RN)          Electronic Cigarette/Vaping:        Never Electronic Cigarette Use.   (Last Updated: 03/06/2021 13:02:42 EDT by Marvis Repress A-RN)          Alcohol:        Denies   Comments:  03/06/2021 13:02 - Marvis Repress A-RN: QUIT 07/14/1980   (Last Updated: 03/06/2021 13:02:56 EDT by Marvis Repress A-RN)          Substance Use:        Opioid Naive - not currently taking opioids, Denies   (Last Updated: 03/06/2021 13:03:04 EDT by Marvis Repress A-RN)            Advance Directive   Advance Directive :   No   Marvis Repress A-RN - 03/06/2021 12:38 EDT   PAT/Clinic Comments   Additional Comments PAT :   8-24: @ PT TO GET K+LEVEL DONE ON [place] ON 8-27.  ORDER PLACED.  DN     Marvis Repress A-RN - 03/06/2021 12:38 EDT

## 2021-03-09 LAB — POTASSIUM: Potassium: 5.1 mmol/L (ref 3.5–5.3)

## 2021-03-14 LAB — POCT GLUCOSE: POC Glucose: 128 mg/dL — ABNORMAL HIGH (ref 65.0–110.0)

## 2021-03-14 NOTE — Anesthesia Post-Procedure Evaluation (Signed)
post anes note PACU        Patient:   JAQUAIN, APPLEGATE            MRN: [id number]            FIN: [id number]               Age:   65 years     Sex:  Male     DOB:  12-03-1955   Associated Diagnoses:   None   Author:   Gerarda Fraction,  Brithany Whitworth-MD      Postoperative Information   Post Operative Info:          Post operative day: Post Anesthesia Care Unit.         Patient location: PACU.       Assessment   Postanesthesia assessment   Vitals: stable--see anesthesia record.     Respiratory function: Respiratory rate, airway, and oxygen saturation are at adequate levels.     Cardiovascular function: Heart Rate stable, Blood Pressure stable, Postoperative hydration status Adequate.     Mental status: appropriate for level of anesthesia.     Temperature: within normal limits.     Pain Control: Adequate.     Nausea/Vomiting: Absent.     Signature Line     Electronically Signed on 03/14/2021 03:57 PM EDT   ________________________________________________   Ronney Lion

## 2021-03-14 NOTE — Anesthesia Pre-Procedure Evaluation (Signed)
Preanesthesia GA        Patient:   Brady Castro, Brady Castro            MRN: [id number]            FIN: [id number]               Age:   65 years     Sex:  Male     DOB:  [birth year]   Associated Diagnoses:   None   Author:   Gerarda Fraction,  Lataysha Vohra-MD      Preoperative Information   Procedure/ Case: cysto with ureteroscopy, RGP, ureteral stent removal   Surgeon scheduled: WINGO,  MARSHALL SCOTT-MD   NPO:  NPO greater than 8 hours.    Anesthesia history     Patient's history: negative.     Family's history: negative.        History of Present Illness   65 y.o. man with HTN, DM, OSA, hypothyroidism, anxiety, smoking      Health Status   Allergies:    Allergic Reactions (Selected)  No Known Allergies   Current medications:    Home Medications (11) Active  amLODIPine 10 mg oral tablet 10 mg = 1 tabs, Oral, At Bedtime (Once a Day)  atorvastatin 20 mg oral tablet 20 mg = 1 tabs, Oral, At Bedtime (Once a Day)  cefdinir 300 mg oral capsule 300 mg = 1 caps, Oral, q12hr  glipiZIDE 2 mg, Oral, BID  levothyroxine 100 mcg (0.1 mg) oral tablet 200 mcg = 2 tabs, Oral  loratadine 10 mg oral tablet 10 mg = 1 tabs, Oral, qAM  losartan 50 mg oral tablet 50 mg = 1 tabs, Oral, At Bedtime (Once a Day)  metFORMIN 1,000 mg, Oral, BID  phenazopyridine 200 mg oral tablet 200 mg = 1 tabs, Oral, TID  potassium chloride 15 mEq oral tablet, extended release 15 mEq = 1 tabs, Oral, BID  Tylenol 1,000 mg, PRN, Oral, q6hr  ,    Medications (3) Active  Scheduled: (0)  Continuous: (1)  Lactated Ringers Injection solution 1,000 mL  1,000 mL, IV, 30 mL/hr  PRN: (2)  lidocaine 1% PF Inj Soln 2 mL  0.25 mL, ID, q5mn  sodium chloride 0.9% Inj Soln 10 mL syringe  30 mL, IV Push, q540m     Problem list:    Active Problems (16)  Anxiety   Arthritis   BMI 45.0-49.9, adult   Diabetes mellitus   High cholesterol   History of 2019 novel coronavirus disease (COVID-19)   History of prostate cancer   History of sepsis   HTN (hypertension)   Hypothyroidism   Kidney stone on left side   OSA  on CPAP   Seasonal allergies   Shortness of breath   Suspected sleep apnea   Wears glasses         Histories   Past Medical History:    No active or resolved past medical history items have been selected or recorded.   Procedure history:    LITHOTRIPSY-RIGHT AND TUBE REMOVED FROM BACK on 02/19/2021 at 6283ears.  RADIATION IMPLANT OF PROSTATE in 2017 at 5830ears.  HERNIA WITH MESH in 2015 at 5685ears.  THYROIDECTOMY WITH TONSILLECTOMY in 2015 at 5647ears.  RIGHT KNEE ARHTROSCOPY- 2 in 2012 at 5317ears.  Comments:  03/06/2021 12:59 EDT - NiMarvis Repress-RN  1997 AND 2012  LAP CHOLE in 2002 at 4337ears.  COLONOSCOPY in 2001 at 4238ears.  LEFT  RTC REPAIR in 1998 at 87 Years.   Social History        Social & Psychosocial Habits    Alcohol  03/06/2021  Use: Denies    Comment: QUIT 07/14/1980 - 03/06/2021 13:02 - Marvis Repress A-RN    Substance Use  03/06/2021  Opioid Assessment Opioid Naive-not taking    Use: Denies    Tobacco  03/06/2021  Use: 10 or more cigarettes (1/    Type: Cigarettes    Comment: 1 1/2 PPD - 03/06/2021 13:02 - Marvis Repress A-RN    Electronic Cigarette/Vaping  03/06/2021  Electronic Cigarette Use: Never  .        Physical Examination   Vital Signs   [id number] EDT Systolic Blood Pressure 123XX123 mmHg    Diastolic Blood Pressure 66 mmHg    Temperature Oral 36.8 degC    Heart Rate Monitored 81 bpm    Respiratory Rate 20 br/min    SpO2 97 %    SBP/DBP Cuff Details Left arm, Automated      Measurements from flowsheet : Measurements   03/14/2021 11:49 EDT Body Mass Index est meas 47.15 kg/m2    Body Mass Index Measured 47.15 kg/m2   03/14/2021 11:00 EDT Patient Stated Height/Length 170 cm    Weight Measured 136.25 kg    Weight Dosing 136.25 kg      Airway:          Mallampati classification: III (soft palate, base of uvula visible).         Thyromental Distance: Normal.         Throat: Within normal limits.    Head:  Normocephalic.    Neck:  Full range of motion.    Respiratory:  Lungs are clear to  auscultation, Breath sounds are equal.    Cardiovascular:  Normal rate, Regular rhythm.    Neurologic:  Alert, Oriented.       Review / Management   Results review:     No qualifying data available, Lab results: 03/14/2021 12:38 EDT       Glucose POC               128.0 mg/dL  HI  .       Assessment and Plan   American Society of Anesthesiologists#(ASA) physical status classification:  Class III.    Anesthetic Preoperative Plan     Anesthetic technique: General anesthesia.     Maintenance airway: Laryngeal mask airway.     Risks discussed.     Signature Line     Electronically Signed on 03/14/2021 01:16 PM EDT   ________________________________________________   Ronney Lion

## 2021-03-14 NOTE — Assessment & Plan Note (Signed)
PreOp Record - [facility]             PreOp Record - [facility] Summary                                                                     Primary Physician:        Leanne Lovely                              SCOTT-MD    Case Number:              [id number]    Finalized Date/Time:      03/14/21 12:40:58    Pt. Name:                 Brady Castro, Brady Castro    D.O.B./Sex:               11-Feb-1956    Male    Med Rec #:                [id number]    Physician:                Leanne Lovely                              SCOTT-MD    Financial #:              [id number]    Pt. Type:                 S    Room/Bed:                 /    Admit/Disch:              03/14/21 11:35:00 -    Institution:       [id number] Case Attendance - PreOp                                                                                              Entry 1                         Entry 2                         Entry 3                                          Case Attendee             Leanne Lovely  Lolita Lenz, LPN, Posey Boyer, RN, Senaida Ores                              SCOTT-MD    Role Performed            Surgeon Primary                 Preoperative Nurse              Preoperative Nurse    Time In     Time Out     Last Modified By:         Lolita Lenz, LPN, Kayren Eaves, LPN, Kayren Eaves, LPN, [facility]                              03/14/21 12:17:54               03/14/21 12:18:17               03/14/21 12:35:54                                Entry 4                                                                                                          Case Attendee             Jola Baptist    Role Performed            PCT    Time In     Time Out     Last Modified By:         Starla Link                              03/14/21 12:35:54      [place] Case Attendance - PreOp Audit                                                               03/14/21 12:35:54         Owner: Rafael Bihari                               Modifier: Rafael Bihari                                                       <+>  3         Case Attendee        <+> 3         Role Performed        <+> 4         Case Attendee        <+> 4         Role Performed     03/14/21 12:18:17         Owner: [id number]                              Modifier: Rafael Bihari                                                       <+> 2         Case Attendee        <+> 2         Role Performed        [facility] Case Times - PreOp                                                                                                   Entry 1                                                                                                          Patient In Room Time      03/14/21 11:48:00               Nurse In Time                   03/14/21 12:18:00    Nurse Out Time            03/14/21 12:38:00               Patient Ready for               03/14/21 12:40:00                                                              Surgery/Procedure     Last Modified By:         Lolita Lenz, LPN, [facility]  03/14/21 12:40:48      [facility] Case Times - PreOp Audit                                                                    03/14/21 12:40:48         Owner: Rafael Bihari                              Modifier: [id number]                                                       <+> 1         Patient Ready for Surgery/Procedure     03/14/21 12:40:43         Owner: Rafael Bihari                              Modifier: Rafael Bihari                                                       <+> 1         Nurse Out Time                Finalized By: Lolita Lenz LPN, [facility]      Document Signatures                                                                             Signed By:           Lolita Lenz LPN, [facility] [id number] 12:40

## 2021-03-14 NOTE — Procedures (Signed)
IntraOp Record - [facility]             IntraOp Record - [facility] Summary                                                                   Primary Physician:        Leanne Lovely                              SCOTT-MD    Case Number:              [id number]    Finalized Date/Time:      03/14/21 15:50:02    Pt. Name:                 Brady Castro, Brady Castro    D.O.B./Sex:               03-15-1956    Male    Med Rec #:                [id number]    Physician:                Leanne Lovely                              SCOTT-MD    Financial #:              [id number]    Pt. Type:                 S    Room/Bed:                 /    Admit/Disch:              03/14/21 11:35:00 -    Institution:       [facility] - Case Times                                                                                                         Entry 1                                                                                                          Patient      In Room Time  03/14/21 15:13:00               Out Room Time                   03/14/21 15:49:00    Anesthesia     Procedure      Start Time               03/14/21 15:35:00               Stop Time                       03/14/21 15:47:00    Last Modified By:         Lajuana Matte M-RN                              03/14/21 15:49:50      [facility] - Case Times Audit                                                                          03/14/21 15:49:50         Owner: [id number]                              Modifier: [id number]                                                       <+> 1         Out Room Time     03/14/21 15:47:33         Owner: [id number]                              Modifier: [id number]                                                       <+> 1         Stop Time     03/14/21 15:35:48         Owner: [id number]                              Modifier: [id number]                                                       <+> 1         Start Time        [facility] - Case Attendance  Entry 1                         Entry 2                         Entry 3                                          Case Attendee             Glennon Mac,  MARSHALL                PHELPS,  Pearlie Oyster  TIMOTHY P-CRNA                              SCOTT-MD    Role Performed            Surgeon Primary                 Anesthesiologist                CRNA    Time In                   03/14/21 15:13:00               03/14/21 15:13:00               03/14/21 15:13:00    Time Out     Procedure                 Cystoscopy with                 Cystoscopy with                              Ureteral Stent                  Ureteral Stent                              Removal(Right),                 Removal(Right),                              Cystoscopy with                 Cystoscopy with                              Ureteroscopy with or            Ureteroscopy with or                              wit(Possible, Right)            wit(Possible, Right)    Last Modified By:         Suzan Slick,  Rose M-RN             Lajuana Matte M-RN                              03/14/21 16:10:96               03/14/21 04:54:09               03/14/21 81:19:14                                Entry 4                         Entry 5                                                                          Case Attendee             Rayna Sexton [place] M-RN    Role Performed            Surgical Scrub                  Circulator    Time In                   03/14/21 15:13:00               03/14/21 15:13:00    Time Out     Procedure                 Cystoscopy with                 Cystoscopy with                              Ureteral Stent                  Ureteral Stent                              Removal(Right),                 Removal(Right),                              Cystoscopy with                 Cystoscopy with                               Ureteroscopy with or            Ureteroscopy with or                              wit(Possible, Right)  wit(Possible, Right)    Last Modified By:         Roselyn Reef             Lajuana Matte M-RN                              03/14/21 15:26:28               03/14/21 15:26:28      [facility] - Case Attendance Audit                                                                     03/14/21 15:26:28         Owner: [id number]                              Modifier: [id number]                                                           1     <+> Time In            1     <*> Procedure                              Cystoscopy with Ureteral Stent Removal(Right), Cystoscopy with                                                             Ureteroscopy with or wit(Possible, Right)            2     <+> Time In            2     <*> Procedure                              Cystoscopy with Ureteral Stent Removal(Right), Cystoscopy with                                                             Ureteroscopy with or wit(Possible, Right)            3     <*> Case Attendee                          GOODE,  HILLIARY M-CRNA            3     <+> Time In  4     <+> Time In            4     <*> Procedure                              Cystoscopy with Ureteral Stent Removal(Right), Cystoscopy with                                                             Ureteroscopy with or wit(Possible, Right)            5     <+> Time In            5     <*> Procedure                              Cystoscopy with Ureteral Stent Removal(Right), Cystoscopy with                                                             Ureteroscopy with or wit(Possible, Right)     03/14/21 14:02:08         Owner: [id number]                              Modifier: [id number]                                                       <+> 2         Case Attendee        <+> 2         Role Performed        <+> 2         Procedure        <+> 3          Case Attendee        <+> 3         Role Performed        <+> 4         Case Attendee        <+> 4         Role Performed        <+> 4         Procedure        <+> 5         Case Attendee        <+> 5         Role Performed        <+> 5         Procedure        [facility] - Skin Assessment  Pre-Care Text:            A.240 Assesses baseline skin condition  Im.120 Implements protective measures to prevent skin or tissue injury           due to mechanical sources   Im.280.1 Implements progective measures to prevent skin or tissue injury due to           thermal sources  Im.360 Monitors for signs and symptons of infection                              Entry 1                                                                                                          Skin Integrity            Intact    Last Modified By:         Lajuana Matte M-RN                              03/14/21 14:00:25    Post-Care Text:            E.10 Evaluates for signs and symptoms of physical injury to skin and tissue  E.270 Evaluate tissue perfusion           O.60 Patient is free from signs and symptoms of injury caused by extraneous objects    O.210 Patinet's tissue           perfusion is consistent with or improved from baseline levels      [facility] - Patient Positioning                                                                      Pre-Care Text:            A.240 Assesses baseline skin condition  A.280 Identifies baseline musculoskeletal status  A.280.1 Identifies           physical alterations that require additional precautions for procedure-specific positioning  A.510.8 Maintains           patient's dignity and privacy  Im.120 Implements protective measures to prevent skin/tissue injury due to           mechanical sources  Im.40 Positions the patient  Im.80 Applies safety devices                              Entry 1  Procedure                 Cystoscopy with                 Body Position                   Lithotomy                              Ureteral Stent                              Removal(Right),                              Cystoscopy with                              Ureteroscopy with or                              wit(Possible, Right)    Left Arm Position         Resting at Side and             Right Arm Position              Resting at Side and                              Padded                                                          Padded    Left Leg Position         Stirrup Leg Support             Right Leg Position              Stirrup Leg Support                              Lift Assist Secured In                                          Science writer Secured In    Feet Uncrossed            Yes                             Pressure Points                 Yes  Checked     Positioning Device        Pillow, Gel Pad                 Positioned By                   [employer],  MARSHALL                                                                                              SCOTT-MD    Outcome Met (O.80)        Yes    Last Modified By:         Lajuana Matte M-RN                              03/14/21 13:59:51    Post-Care Text:            E.10 Evaluates for signs and symptoms of physical injury to skin and tissue  E.290 Evaluates musculoskeletal           status  O.80 Patient is free from signs and symptoms of injury related to positioning  O.120 the patient is           free from signs and symptoms of injury related to transfer/transport   O.250 Patient's musculoskeletal status           is maintained at or improved from baseline levels      [facility] - Skin Prep                                                                                Pre-Care Text:            A.30 Verifies allergies  A.20 Verifies procedure,  surgical site, and laterality  A.510.8 Maintains paritnet's           dignity and privacy  Im.270 Performs Skin Preparation  Im.270.1 Implements protective measures to prevent skin           and tissue injury due to chemical sources   A.300.1 Protects from cross-contamination                              Entry 1  Hair Removal     Skin Prep      Prep Agents (Im.270)     Chlorhexidine Gluconate         Prep Area (Im.270)              Penis Scrotum Groin                              4%     Prep By                  Glennon Mac,  MARSHALL                              SCOTT-MD    Outcome Met (O.100)       Yes    Last Modified By:         Lajuana Matte M-RN                              03/14/21 14:00:51    Post-Care Text:            E.10 Evaluates for signs and symptoms of physical injury to skin and tissue  O.100 Patient is free from signs           and symptoms of chemical injury   O.740 The patient's right to privacy is maintained      [facility] - General Case Data                                                                        Pre-Care Text:            A.350.1 Classifies surgical wound                              Entry 1                                                                                                          Case Information      ASA Class                3                               Case Level                      Level 3     OR                       SF  Cysto                        Specialty                       Urology (SN)     Wound Class              2-Clean-Contaminated    Preop Diagnosis           KIDNEY STONES                   Postop Diagnosis                KIDNEY STONES    Last Modified By:         Lajuana Matte M-RN                              03/14/21 13:59:13    Post-Care Text:            O.760 Patient receives consistent and comparable care regardless of the setting      [facility] - Fire  Risk Assessment                                                                                               Entry 1                                                                                                          Fire Risk                 Oxygen Source                   Fire Risk Score                 0-1    Assessment: If     checked, checkmark     = 1 point     Fire Risk Protocol     (Reference Only)      0-1                      Utilize standard                              draping procedure.    Last Modified By:         Lajuana Matte M-RN  03/14/21 13:59:33      [facility] - Time Out                                                                                 Pre-Care Text:            A.10 Confirms patient identity  A.20 Verifies operative procedure, surgical site, and laterality  A.20.1           Verifies consent for planned procedure  A.30 Verifies allergies                              Entry 1                                                                                                          Procedure                 Cystoscopy with                 Patient name and                Yes                              Ureteral Stent                  DOB confirmed.                               Removal(Right),                 Allergies                               Cystoscopy with                 confirmed. Surgical                               Ureteroscopy with or            procedure to be                               wit(Possible, Right)            performed confirmed  and verified by                                                               completed surgical                                                               consent     Surgical site             Yes                             Essential imaging,              Yes    confirmed. Correct                                        required blood      surgical site                                             products, implants,     marked and initials                                       devices and/or     are visible through                                       special equipment     prepped and draped                                        available and     field (or                                                 sterilization     alternative ID band                                       indicators confirmed     used), if applicable     Surgeon shares            Yes                             Anesthesia shares  Yes    operative plan,                                           anesthetic plan and     anticipated                                               reviews patient     specimens                                                 specific concerns     discussed, possible                                       and confirms     difficulties,                                             administration of     expected duration,                                        antibiotics, if     anticipated blood                                         applicable     loss and reviews     all     critical/specific     concerns     Fire risk                 Yes                             Surgeon states:                 Yes    assessment scored                                         Does anyone have     and plan discussed                                        any concerns? If  you see, suspect,                                                               or feel that                                                               patient care is                                                               being compromised,                                                               speak up for                                                               patient safety     Time Out Complete          03/14/21 15:33:00    Last Modified By:         Lajuana Matte M-RN                              03/14/21 15:35:34    Post-Care Text:            E.30 Evaluates verification process for correct patient, site, side, and level surgery      [facility] - Time Out Audit                                                                            03/14/21 15:35:34         Owner: [id number]                              Modifier: [id number]  1     <*> Procedure                              Cystoscopy with Ureteral Stent Removal(Right), Cystoscopy with                                                             Ureteroscopy with or wit(Possible, Right)            1     <+> Time Out Complete        [facility] - Debrief                                                                                  Pre-Care Text:            Im.330 Manages specimen handling and disposition                              Entry 1                                                                                                          Procedure                 Cystoscopy with                 Final counts                    Yes                              Ureteral Stent                  correct and                               Removal(Right),                 verbally verified                               Cystoscopy with                 with  Ureteroscopy with or            surgeon/licensed                               wit(Possible, Right)            independent                                                               practitioner (if                                                               applicable)     Actual procedure          Yes                             Postop diagnosis                Yes    performed confirmed                                       confirmed     Wound                     Yes                             Confirm specimens               Yes     classification                                            and specimens     confirmed                                                 labeled                                                               appropriately (if                                                               applicable)     Equipment problems  Yes                             Foley catheter                  Yes    addressed (if                                             removed (if     applicable)                                               applicable)     Patient recovery          Yes                             Debrief Complete                03/14/21 15:46:00    plan confirmed     Last Modified By:         Lajuana Matte M-RN                              03/14/21 15:46:21    Post-Care Text:            E.800 Ensures continuity of care  E.50 Evaluates results of the surgical count  O.30 Patient's procedure is           performed on the correct site, side, and level  O.50 patient's current status is communicated throughout the           continuum of care  O.40 Patient's specimen(s) is managed in the appropriate manner      [facility] - Debrief Audit                                                                             03/14/21 15:46:21         Owner: [id number]                              Modifier: [id number]                                                           1     <*> Procedure                              Cystoscopy with Ureteral Stent Removal(Right), Cystoscopy with  Ureteroscopy with or wit(Possible, Right)            1     <+> Debrief Complete        [facility] - Patient Care Devices                                                                     Pre-Care Text:            A.200 Assesses risk for normothermia regulation  A.40 Verifies presence of prosthetics or corrective devices           Im.280 Implements thermoregulation measures  Im.60 Uses supplies and equipment  within safe parameters                              Entry 1                                                                                                          Equipment Type            MACHINE SEQUENTIAL              SCD Sleeve Site                 Legs Bilateral                              COMPRESSION    Initiated Pre             Yes    Induction     Last Modified By:         Lajuana Matte M-RN                              03/14/21 13:59:41    Post-Care Text:            E.10 Evaluates signs and symptoms of physical injury to skin and tissue  O.60 Patient is free from signs and           symptoms of injury caused by extraneous objects      [facility] - Procedures                                                                               Pre-Care Text:            A.20 Verifies operative procedure, surgical  site, and laterality  Im.150 Develops individualized plan of care                              Entry 1                         Entry 2                                                                          Procedure     Description      Procedure                Cystoscopy with                 Cystoscopy with                              Ureteral Stent Removal          Ureteroscopy with or                                                              without Retrograde                                                              Pyelogram     Modifiers                Right                           Possible, Right     Surgical Procedure       RIGHT STENT REMOVAL             CYSTO POSS RIGHT     Text                                                     URETEROSCOPY    Primary Procedure         No                              Yes    Primary Surgeon           Unknown Jim,  MARSHALL  SCOTT-MD                        SCOTT-MD    Start                     03/14/21 15:35:00               03/14/21 15:35:00    Stop                      03/14/21 15:47:00                03/14/21 15:47:00    Anesthesia Type           General                         General    Surgical Service          Urology [facility])                    Urology (SN)    Wound Class               2-Clean-Contaminated            2-Clean-Contaminated    Last Modified By:         Roselyn Reef             Lajuana Matte M-RN                              03/14/21 15:47:35               03/14/21 15:47:35    Post-Care Text:            O.730 The patient's care is consistent with the individualized perioperative plan of care      [facility] - Procedures Audit                                                                          03/14/21 15:47:35         Owner: [id number]                              Modifier: [id number]                                                       <+> 1         Stop        <+> 2         Stop     03/14/21 15:46:22         Owner: [id number]                              Modifier: [id number]                                                       <+>  1         Start        <+> 2         Start        [facility] - Transfer                                                                                                           Entry 1                                                                                                          Transferred By            Desmond Lope P-CRNA,         Via                             Cena Benton M-RN    Post-op Destination       PACU    Skin Assessment      Condition                Intact    Last Modified By:         Lajuana Matte M-RN                              03/14/21 15:26:48      Case Comments                                                                                         <None>              Finalized By: Lajuana Matte M-RN      Document Signatures  Signed By:           Lajuana Matte M-RN 03/14/21 15:50

## 2021-03-14 NOTE — Op Note (Signed)
Phase II Record - SFOR             Phase II Record - SFOR Summary                                                                  Primary Physician:        Leanne Lovely                              SCOTT-MD    Case Number:              [id number]    Finalized Date/Time:      03/14/21 17:06:15    Pt. Name:                 Brady Castro, Brady Castro    D.O.B./Sex:               [birth year]    Male    Med Rec #:                [id number]    Physician:                Leanne Lovely                              SCOTT-MD    Financial #:              [id number]    Pt. Type:                 S    Room/Bed:                 /    Admit/Disch:              03/14/21 11:35:00 -    Institution:       [id number] Case Attendance - Phase II                                                                                           Entry 1                         Entry 2                         Entry 3                                          Case Attendee             Althia Forts  Erroll Luna, RN, [place]                              SCOTT-MD                                                        [place]    Role Performed            Surgeon Primary                 Phase II Nurse                  Phase II Nurse    Time In     Time Out     Last Modified By:         Dyann Kief L-RN                              03/14/21 16:29:33               03/14/21 16:29:33               03/14/21 16:29:33      [place] Case Times - Phase II                                                                                                Entry 1                                                                                                          Phase II In               03/14/21 16:25:00               Phase II Out                    03/14/21 16:42:00    Phase II Discharge        03/14/21 17:06:00    Time     Last Modified By:         Quay Burow L-RN  03/14/21 17:06:06      SFOR Case Times - Phase II Audit                                                                 03/14/21 17:06:06         Owner: Judie Grieve                               Modifier: [email or web address]                                                        <+> 1         Phase II Discharge Time     03/14/21 16:49:10         Owner: Judie Grieve                               Modifier: [email or web address]                                                        <+> 1         Phase II Out                Finalized By: Quay Burow L-RN      Document Signatures                                                                             Signed By:           Quay Burow L-RN 03/14/21 17:06

## 2021-03-14 NOTE — Nursing Note (Signed)
Nursing Discharge Summary - Text       Nursing Discharge Summary Entered On:  03/14/2021 16:32 EDT    Performed On:  03/14/2021 16:32 EDT by Ander Purpura               DC Information   Discharge To :   Home independently, Family support   Mode of Discharge :   Wheelchair   Transportation :   Private vehicle   Accompanied By :   Randa Ngo L-RN - 03/14/2021 16:32 EDT

## 2021-03-14 NOTE — Op Note (Signed)
Phase I Record - SFOR             Phase I Record - SFOR Summary                                                                   Primary Physician:        Leanne Lovely                              SCOTT-MD    Case Number:              [id number]    Finalized Date/Time:      03/14/21 16:23:22    Pt. Name:                 Brady Castro, Brady Castro    D.O.B./Sex:               [birth year]    Male    Med Rec #:                [id number]    Physician:                Leanne Lovely                              SCOTT-MD    Financial #:              [id number]    Pt. Type:                 S    Room/Bed:                 /    Admit/Disch:              03/14/21 11:35:00 -    Institution:       [id number] Case Attendance - Phase I                                                                                            Entry 1                         Entry 2                                                                          Case Attendee             Leanne Lovely  Fayne Norrie, RN, Carmelia Roller                              SCOTT-MD    Role Performed            Surgeon Primary                 Post Anesthesia Care                                                              Nurse    Time In                                                   03/14/21 15:52:00    Time Out     Last Modified By:         Fayne Norrie RN, Mingo Amber, RNCarmelia Roller                              03/14/21 15:58:13               03/14/21 16:00:03      SFOR Case Attendance - Phase I Audit                                                             03/14/21 16:00:03         Owner: STERCH                               Modifier: [place]                                                        <+> 2         Case Attendee        <+> 2         Role Performed        <+> 2         Time In        SFOR Case Times - Phase I  Entry 1                                                                                                           Phase I In                03/14/21 15:52:00               Phase I Out                     03/14/21 16:22:00    Phase I Discharge         03/14/21 16:22:00    Time     Last Modified By:         Julianne Rice                              03/14/21 16:17:43      SFOR Case Times - Phase I Audit                                                                  03/14/21 16:17:43         Owner: STERCH                               Modifier: STERCH                                                        <+> 1         Phase I Out        <+> 1         Phase I Discharge Time                Finalized By: BURGIS, RN, [place]                                                                             Signed By:           Fayne Norrie RN, Carmelia Roller 03/14/21 16:23

## 2021-03-14 NOTE — Nursing Note (Signed)
Nursing Discharge Summary - Text       Physician Discharge Summary Entered On:  03/14/2021 15:52 EDT    Performed On:  03/14/2021 15:52 EDT by Leanne Lovely SCOTT-MD               DC Information   Provider Instructions for Diet :   Consistent carbohydrate/Diabetic diet   Provider Instructions for Activity :   As [place]   Deno Sida,  Jailynn Lavalais SCOTT-MD - 03/14/2021 15:52 EDT

## 2021-03-14 NOTE — Discharge Summary (Signed)
Inpatient Patient Summary              [facility]  [address], [place] [id number]  [id number]  Discharge Instructions (Patient)  Name: Brady Castro, Brady Castro  DOB: [birth year]                   MRN: [id number]                   FIN: [id number]  Reason For Visit: KIDNEY STONES  Final Diagnosis: Nephrolithiasis, uric acid     Visit Date: 03/04/2021 13:50:37  Address: [place] [address]  Phone: [phone]     Providers:         Primary Physician:      None      Admitting Providers: Leanne Lovely SCOTT-MD  Attending Providers: Leanne Lovely SCOTT-MD     [facility] would like to thank you for allowing [place] to assist you with your healthcare needs. The following includes patient education materials and information regarding your injury/illness.     Follow-up Instructions:  You were seen today on an emergency basis. Please contact your primary care doctor for a follow up appointment. If you received a referral to a specialist doctor, it is important you follow-up as instructed.    It is important that you call your follow-up doctor to schedule and confirm the location of your next appointment. Your doctor may practice at multiple locations. The office location of your follow-up appointment may be different to the one written on your discharge instructions.    If you do not have a primary care doctor, please call [phone] for help in finding a [facility] Provider. For help in finding a specialist doctor, please call [phone].    If your condition gets worse before your follow-up with your primary care doctor or specialist, please return to the Emergency Department.      Coronavirus 2019 (COVID-19) Reminders:     Patients age 8 - 85, with parental consent, and patients over age 89 can make an appointment for a COVID-19 vaccine. Patients can contact their [facility] doctors' offices to schedule an appointment to receive  the COVID-19 vaccine. Patients who do not have a [facility] physician can call [phone] to schedule vaccination appointments.      Follow Up Appointments:  Primary Care Provider:     Name: Irma Newness T-FNP     Phone: [phone]                 With: Address: When:   Leanne Lovely [address] [place] [address]  [phone] Business (1) In 6 weeks       With: Address: When:   Irma [place]ess [place] [place] [address]  [phone] Business (1)               [facility] SERVICES%>  Discharge Service Agency Selected  DISCHARGE SERVICE AGENCY SELECTED%>     DME Agency  DME AGENCY%>             Medications that have not changed  Other Medications  acetaminophen (Tylenol) 1,000 Milligram Oral (given by mouth) every 6 hours as needed mild pain (1-3).  Last Dose:____________________  amLODIPine (amLODIPine 10 mg oral tablet) 1 Tabs Oral (given by mouth) At Bedtime (Once a Day).  Last Dose:____________________  atorvastatin (atorvastatin 20 mg oral tablet) 1 Tabs Oral (given by mouth) At Bedtime (Once a Day).  Last Dose:____________________  cefdinir (cefdinir 300 mg oral capsule) 1 Capsules Oral (given by mouth) every 12 hours for 14 Days.  Last Dose:____________________  glipiZIDE 2 Milligram Oral (given by mouth) 2 times a day.  Last Dose:____________________  levothyroxine (levothyroxine 100 mcg (0.1 mg) oral tablet) 2 Tabs Oral (given by mouth).  Last Dose:____________________  loratadine (loratadine 10 mg oral tablet) 1 Tabs Oral (given by mouth) once a day (in the morning) for 7 Days.  Last Dose:____________________  losartan (losartan 50 mg oral tablet) 1 Tabs Oral (given by mouth) At Bedtime (Once a Day).  Last Dose:____________________  metFORMIN 1,000 Milligram Oral (given by mouth) 2 times a day.  Last Dose:____________________  phenazopyridine (phenazopyridine 200 mg oral tablet) 1 Tabs Oral (given by mouth) 3 times a day for 7 Days.  Last  Dose:____________________  potassium chloride (potassium chloride 15 mEq oral tablet, extended release) 1 Tabs Oral (given by mouth) 2 times a day.  Last Dose:____________________      Allergy Info: No Known Allergies     Discharge Additional Information          Discharge Patient 03/14/21 15:52:00 EDT, Discharge Home/Self Care      Patient Education Materials:       ---------------------------------------------------------------------------------------------------------------------  [facility] allows patients to review your COVID and other test results as well as discharge documents from any [facility], Emergency Department, surgical center or outpatient lab. Test results are typically available 36 hours after the test is completed.     [place] encourages you to self-enroll in the [facility] Patient Portal.     To begin your self-enrollment process, please visit [email or web address] [facility], click on ???Sign up now???.     NOTE: You must be 16 years and older to use [facility] Self-Enroll online. If you are a parent, caregiver, or guardian; you need an invite to access your child???s or dependent???s health records. To obtain an invite, contact the Medical Records department at [phone] Monday through Friday, 8-4:30, select option 3 . If we receive your call afterhours, we will return your call the next business day.     If you have issues trying to create or access your account, contact [employer] support at [phone] available 7 days a week 24 hours a day.     Comment:

## 2021-03-14 NOTE — Discharge Summary (Signed)
Inpatient Clinical Summary             [facility]  Post-Acute Care Transfer Instructions  PERSON INFORMATION   Name: Brady Castro, Brady Castro  MRN: [id number]    FIN#: [id number]   PHYSICIANS  Admitting Physician: Leanne Lovely SCOTT-MD  Attending Physician: Leanne Lovely SCOTT-MD   PCP: Irma Newness T-FNP  Discharge Diagnosis:  Nephrolithiasis, uric acid  Comment:       PATIENT EDUCATION INFORMATION  Instructions:               Medication Leaflets:               Follow-up:                           With: Address: When:   [facility] [address] [place] [address]  [phone] Business (1) In 6 weeks       With: Address: When:   [place] [address]  [phone] Business (1)                                     Type Location Start [place]   Anesthesia Room Hold [facility] OR Sep/07/2020 4:30 PM Sep/07/2020 6:00 PM Confirmed          MEDICATION LIST  Medication Reconciliation at Discharge:          Medications that have not changed  Other Medications  acetaminophen (Tylenol) 1,000 Milligram Oral (given by mouth) every 6 hours as needed mild pain (1-3).  Last Dose:____________________  amLODIPine (amLODIPine 10 mg oral tablet) 1 Tabs Oral (given by mouth) At Bedtime (Once a Day).  Last Dose:____________________  atorvastatin (atorvastatin 20 mg oral tablet) 1 Tabs Oral (given by mouth) At Bedtime (Once a Day).  Last Dose:____________________  cefdinir (cefdinir 300 mg oral capsule) 1 Capsules Oral (given by mouth) every 12 hours for 14 Days.  Last Dose:____________________  glipiZIDE 2 Milligram Oral (given by mouth) 2 times a day.  Last Dose:____________________  levothyroxine (levothyroxine 100 mcg (0.1 mg) oral tablet) 2 Tabs Oral (given by mouth).  Last Dose:____________________  loratadine (loratadine 10 mg oral tablet) 1 Tabs Oral (given by mouth) once a day (in the morning) for 7 Days.  Last Dose:____________________  losartan (losartan 50 mg oral tablet)  1 Tabs Oral (given by mouth) At Bedtime (Once a Day).  Last Dose:____________________  metFORMIN 1,000 Milligram Oral (given by mouth) 2 times a day.  Last Dose:____________________  phenazopyridine (phenazopyridine 200 mg oral tablet) 1 Tabs Oral (given by mouth) 3 times a day for 7 Days.  Last Dose:____________________  potassium chloride (potassium chloride 15 mEq oral tablet, extended release) 1 Tabs Oral (given by mouth) 2 times a day.  Last Dose:____________________         Patient???s Final Home Medication List Upon Discharge:           acetaminophen (Tylenol) 1,000 Milligram Oral (given by mouth) every 6 hours as needed mild pain (1-3).  amLODIPine (amLODIPine 10 mg oral tablet) 1 Tabs Oral (given by mouth) At Bedtime (Once a Day).  atorvastatin (atorvastatin 20 mg oral tablet) 1 Tabs Oral (given by mouth) At Bedtime (Once a Day).  cefdinir (cefdinir 300 mg oral capsule) 1 Capsules Oral (given by mouth) every 12 hours for 14 Days.  glipiZIDE 2 Milligram Oral (given by mouth) 2 times a day.  levothyroxine (levothyroxine 100 mcg (0.1 mg) oral tablet) 2 Tabs Oral (given by mouth).  loratadine (loratadine 10 mg oral tablet) 1 Tabs Oral (given by mouth) once a day (in the morning) for 7 Days.  losartan (losartan 50 mg oral tablet) 1 Tabs Oral (given by mouth) At Bedtime (Once a Day).  metFORMIN 1,000 Milligram Oral (given by mouth) 2 times a day.  phenazopyridine (phenazopyridine 200 mg oral tablet) 1 Tabs Oral (given by mouth) 3 times a day for 7 Days.  potassium chloride (potassium chloride 15 mEq oral tablet, extended release) 1 Tabs Oral (given by mouth) 2 times a day.         Comment:       ORDERS          Order Name Order Details   Discharge Patient 03/14/21 15:52:00 EDT, Discharge Home/Self Care

## 2021-03-15 NOTE — Op Note (Signed)
Operative Report    Patient Name: Brady Castro, Brady Castro.  Patient DOB: 1956/06/25    [facility] Leonie Green, MD  Service Date: 03/14/2021    PREOPERATIVE DIAGNOSIS:  Right-sided nephrolithiasis, hydronephrosis,   and flank pain.    POSTOPERATIVE DIAGNOSES:  Right-sided nephrolithiasis, hydronephrosis,   and flank pain.    PROCEDURES PERFORMED:  Cystoscopy, right double-J stent removal, right   ureteroscopy.    INDICATIONS FOR PROCEDURE:  The patient is a gentleman who has   previously had a very large stone burden within the right kidney.  This   has been broken down with laser lithotripsy.  He has appeared to have   passed most of his remaining fragments, but CT scan shows some small   fragments collecting in the lower pole.  Plan is to give him a brief   anesthetic, remove the stent and also examine his upper collecting   system on the right hand side for any possible residual stones that   needs to be laser lithotripsy or basketed out.  He understands the risks  involved with this procedure including possibility of bleeding,   infection, anesthetic complications, possibility of finding adverse   pathology, possible need for subsequent procedures.  He understands all   these risks and wished to proceed.    DESCRIPTION OF PROCEDURE:  On the above date, the patient was taken to   the main cystoscopy suite at [facility] where he was   placed under LMA anesthesia.  It is important to note that prior to   induction of anesthesia, sequential compression devices were placed   lower extremity was activated.  Prophylactic antibiotics were given.    Once under appropriate anesthesia, he was placed in a comfortable low   lithotomy position.  All contact points were adequately padded.  No   extremity bent greater than 90 degrees.  He then underwent typical   Urology sterile prep about the lower abdomen, penile region scrotal   region and upper thighs.  Sterile drapes were used to  isolate the   operative field.  I initiated the procedure by placing a 22-French   cystoscope with 30-degree lens through the urethra.  Urethra was normal   in appearance.  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  Scope was advanced through a patent bladder neck to the lumen of   the bladder.  Full visualization of the bladder was performed anterior,   posterior, left lateral aspect.  No evidence of any mucosal lesions, no   malignancy identified.  Both ureteral orifices were seen.  The   previously placed stent was seen emanating out of the right ureteral   orifice.  This was grasped with a grasper and brought out the meatus.  A  0.035 Glidewire was advanced through the stent into the upper collecting  system under fluoroscopic guidance and the stent was removed.  Wire was   left intact as a safety wire.  A flexible ureteroscope was advanced over  the wire using Seldinger technique into the upper collecting system on   the right.  The wire was removed.  Post-surveillance of the calices was   performed.  Upper mid and lower pole calices were surveyed.  There was   no evidence of any additional larger stone fragments.  There were some   small minute fragments present.  These were too small to basket and   should wash out with  irrigation and most of them did.  There was no   evidence of any other residual stone.  The ureter was examined along its  entire length with the ureteroscope and no other additional stone   fragments were identified.  There was no evidence of any other ureteral   damage or irritation.  Therefore, it was not felt to be necessary to   replace a stent.  The cystoscope was used to drain the bladder   completely and then it was removed.  The patient was awakened from LMA   anesthesia and transferred to the postoperative care unit in stable   condition.  No complications.  Findings as above.  No blood loss.  He   will be discharged as an outpatient.  We will try to dissolve  the stones  on the left hand side.      Hosie Spangle, MD  TR: th DD: 03/15/2021 08:30 TD: 03/15/2021 08:40  Job#: [id number] DOC#: [id number]  Signature Line    Electronically Signed on 03/15/2021 01:08 PM EDT  ________________________________________________  Leanne Lovely SCOTT-MD

## 2021-05-30 ENCOUNTER — Encounter: Attending: Hematology & Oncology | Primary: Internal Medicine

## 2021-06-12 ENCOUNTER — Encounter

## 2021-06-13 ENCOUNTER — Ambulatory Visit
Admit: 2021-06-13 | Discharge: 2021-06-13 | Payer: PRIVATE HEALTH INSURANCE | Attending: Hematology & Oncology | Primary: Internal Medicine

## 2021-06-13 DIAGNOSIS — K8689 Other specified diseases of pancreas: Secondary | ICD-10-CM

## 2021-06-13 NOTE — Progress Notes (Signed)
Progress Note -- Hematology/ Medical Oncology    Patient Name: Brady Castro Attending: Pilar Plate, MD   DOB: August 06, 1955  Age:65 y.o. 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  He underwent treatment with brachytherapy and pelvic radiation followed by androgen deprivation therapy.  Treatment was completed in [place], [place].     April 2022 the patient relocated to [place], [place].  PSA on initial check with Dr. Luretha Rued showed a PSA of 0.27 which had increased from undetectable levels on the prior evaluation.  Due to biochemical recurrence he was scheduled to undergo imaging.     10/23/2020 CT abdomen and pelvis with IV contrast only shows a 1.0 cm solid pulmonary nodule partially visualized in the right middle lobe. There is a questionable 5 mm solid pulmonary nodule in the left lower lobe.  Liver is enlarged measuring 22 cm.  Gallbladder is surgically absent. The pancreas shows a questionable ill-defined hypoattenuating mass within the uncinate process measuring 2.8 x 1.9 x 2.3 cm.  The left adrenal gland is normal.  There is an indeterminate oval 2.3 cm mass in the right adrenal gland.  There are numerous cysts throughout both kidneys measuring up to 3.7 cm on the left and 2.5 cm on the right. There are non-obstructing stones bilaterally measuring up to 2.0 cm on the left and 1.1 cm on the right.  There is mild left sided hydronephrosis and proximal hydroureter secondary to a 6 mm stone in the mid-left ureter.  The urinary bladder is normal in appearance.  There is no lymphadenopathy.  There are multiple radiation beads within the non-enlarged prostate gland.  The bones show no acute fracture, dislocation or destructive osseous lesion.  Soft tissue is normal.  MRI was recommended to evaluate a right adrenal lesion as well as the indeterminate  pancreatic lesion.    10/23/20 whole body bone scan shows no evidence of bony metastatic disease.   11/01/20 PSA 0.2, CA 19-9 17  11/09/2020 MRCP abdomen shows a 1.7 cm multiseptated cystic lesion in the uncinate process of the pancreas.  No enhancement.  No ductal dilatation.  No biliary dilatation.  Normal signal of the liver parenchyma.  Prior cholecystectomy.  Spleen, abdominal aorta, and bowel loops appear unremarkable.  Mild left hydronephrosis and hydroureter related to the left ureteral stone seen on 10/23/2020 CT.  Benign right adrenal adenoma measuring 2.3 cm.  Left adrenal is normal.  No lymphadenopathy.  11/26/2020 CT A/P without contrast shows a stable 3 mm nodule in the periphery of the left lower lobe.  2.5 x 1.5 cm right adrenal nodule consistent with adenoma.  10 x 7 mm calculus in the distal right ureter.  Proximal portion of the right ureter is moderately to markedly distended.  Moderate right hydronephrosis.  Right perinephric stranding or proximal periureteral rec stranding is present.  Nonobstructing bilateral nephroliths, largest in the left midpole kidney.  Back calculus has a staghorn configuration.  Prostate gland radiation implant seeds.  11/27/2020 CT chest bilateral pulmonary nodules unchanged compared with 2018.  06/05/2021 MRCP abdomen shows a multilocular cystic lesion within the uncinate process, increased in size now measuring 2.2 x 2.1 cm.  Endoscopic ultrasound suggested.  No focal hepatic lesions.    Chief Complaint   Follow up    History of Present  Illness   Brady Castro is a 65 y.o. male who returns for 63-month follow-up of a cystic pancreatic mass.  This lesion was evaluated with MRCP in May and was favored to reflect a benign etiology but follow-up was recommended to document stability.  MRCP was repeated 06/05/2021 and showed the lesion to have enlarged in size.  Patient does admit to abdominal pain but also tells me that he has been hospitalized 3 times since his last visit  related to kidney stones.  2 hospitalizations were at [facility] and 1 at [facility] in [place].  He was treated for urosepsis.  He required lithotripsy to stones in the right side.  He was then placed on medical management to help the stone on the left side.  He passed a kidney stone on Saturday, 06/08/2021.  He continues to follow with his urologist, Dr. Leonie Green, regularly.  He continues to smoke cigarettes daily.  He has chronic nausea but denies worsening symptoms.  He denies vomiting.  He denies yellowing of his skin and/or eyes.  His weight has been stable.  Cystic pancreatic mass    Review of Systems   10 point ROS as per history of present illness. All others reviewed and negative.    Past Medical History         Diagnosis Date    Arthritis     Diabetes mellitus ([place])     Type II    Hyperlipidemia     Hypertension     Hypothyroidism     Osteoarthritis     Prostate cancer [facility])         Past Surgical History         Procedure Laterality Date    CHOLECYSTECTOMY  2002    HERNIA REPAIR  2015    KNEE ARTHROSCOPY Right 1997    repeat in 2013    PROSTATE BIOPSY  2017    [place]    THYROID SURGERY  2015    TONSILLECTOMY  2015        Social History    reports that he has been smoking cigarettes. He has a 66.00 pack-year smoking history. He does not have any smokeless tobacco history on file. He reports that he does not drink alcohol.     Family History         Problem Relation Age of Onset    Breast Cancer Mother     Brain Cancer Father     Breast Cancer Sister     Breast Cancer Sister     Cervical Cancer Sister     Melanoma Sister     Thyroid Cancer Sister         Allergies   No Known Allergies   Medications     No current outpatient medications on file.     No current facility-administered medications for this visit.     Physical Exam   There were no vitals filed for this visit.   General: No acute distress obese  HEENT:Normocephalic, EOMI, PERRL, external ears and nose  without abnormality, wearing a nose strip  Neck: No tenderness, No palpable mass  Respiratory: Bilateral lung fields clear to auscultation. No accessory muscle use.  Cardiovascular: Regular rate and rhythm. No murmurs. No peripheral edema.  Abdomen: Normal active bowel sounds. Soft. No tenderness. No palpable mass.   Lymph nodes: No cervical, supraclavicular, or axillary adenopathy.  Skin: No rash. No petechiae or purpura. No cutaneous  nodules.   Musculoskeletal: No bony tenderness. No deformities.   Neurological: No sensory or motor deficit. AAO x 3.   Psychiatric: Mood appropriate for situation. Appropriate judgement and insight.    LABS AND IMAGING:   CBC and BMP reviewed.  Scanned in chart.     ASSESSMENT AND PLAN:   Cystic pancreatic mass: Due to the enlargement over the 50-month interval, patient will be referred to [place] for endoscopic ultrasound evaluation.    Prostate cancer: Managed by Dr. Glennon Mac.    Kidney stones: Most recent stone passed within the last 1 week.  He denies flank pain and hematuria currently.  He will continue vigorous hydration with water.  He will continue to follow with his urologist, Dr. Glennon Mac.    Tobacco dependence: He was counseled on the importance of tobacco cessation again today.     No follow-ups on file.  Orders Placed This Encounter   Procedures    [place] [place]     No orders of the defined types were placed in this encounter.            Caryl Pina Abide Trey Sailors, [place]  [place]  [phone]

## 2021-06-14 ENCOUNTER — Encounter

## 2021-06-15 LAB — HEPATIC FUNCTION PANEL
ALT: 28 U/L (ref 0–50)
AST: 19 U/L (ref 0–50)
Albumin: 4.1 g/dL (ref 3.5–5.2)
Alk Phosphatase: 139 U/L — ABNORMAL HIGH (ref 40–130)
Bilirubin, Direct: <0.2 mg/dL (ref 0.00–0.30)
Total Bilirubin: 0.3 mg/dL (ref 0.00–1.20)
Total Protein: 6.8 g/dL (ref 6.4–8.3)

## 2021-06-15 LAB — TOTAL PSA WITH FREE PSA REFLEX: PSA, TOTAL: 1 ng/mL (ref 0.000–4.000)

## 2021-06-21 NOTE — Progress Notes (Signed)
Pre Procedure Patient Instructions    Procedure Location hospital:[facility]: [address] [place] off at Outpatient Services to the left of [facility] entrance and check in at the Surgery Reception desk to your immediate left.    Procedure Date 06/25/21  Arrival Time  7AM      Medications:  Medication to be taken the morning of surgery with a few sips of water only: AMLODIPINE, CLARITIN, LEVOTHYROXNE    Stop all supplements, vitamins and herbal remedies one week prior to your procedure, unless your doctor told you to continue taking.  Do not take over the counter pain medications except plain Tylenol or acetaminophen unless your doctor told you to do so.  If you are taking blood thinners, call the doctor performing your procedure and prescribing physician for instructions on when to stop.    Procedure Preparation    Diet Restrictions:No food or drink including gum or mints -after midnight    Skin Preparation:   Wash with Hibiclens or an antibacterial soap (e.g. Dial soap) the night before and morning of procedure.  Using a clean washcloth, wash from neck to toes for 3 minutes.Gently clean the area where your surgery will be done thoroughly  Do not use Hibiclens on or near your face, eyes, ears, or head  Do not put on any deodorants, lotions, powders, or oils afterwards. Be sure to put on clean clothing.    Other Preparation:  Call your surgeon right away if you get any wounds, cuts, scrapes, scabs, rashes, bug bites at or near your operative site.  Bowel prep as instructed by your doctor  Fleets enema as instructed by your doctor  No test required before surgery      Day of Procedure Patient Instruction:  Do not smoke, vape, chew tobacco, drink alcohol or use recreational drugs on the day of your procedure  Remove all jewelry, piercings and metal accessories  Do not wear contacts, tampons, make-up, lotions, creams, powders, fragrances or deodorant  Do not bring  valuables or money  Bring a picture ID and insurance card and any of the following that are applicable to you:    ?? Inhalers    ?? CPAP or BiPAP machine    ?? Remote for spinal cord stimulator or other implanted device    ?? Insulin pump supplies    ?? Walker or other orthopedic device necessary for postop    ?? Storage case for eyeglasses, hearing aids, dentures, etc    ?? A loose button-up shirt if instructed    ?? Copy of your Living Will and/or Medical Durable Power of Attorney    ?? List of current medications including name and dosage    .  If you are going home the same day as your procedure, a support person should accompany you to the facility and must transport you home.  If you plan to take public transportation of any sort, your support person must accompany you home.  You will need someone to stay with you for 24 hours after your procedure with sedation of any kind.       COVID Testing Instructions:  No COVID testing required as discussed.       Comments:     The information and visitor policy was reviewed with you during your Pre-Admission Testing interview and you verbalized understanding. If you have any additional questions please contact [phone]    For financial questions regarding your procedure at a  [facility] facility, please contact [phone], option 1

## 2021-06-22 LAB — CEA: CEA: 8.6 ng/mL — ABNORMAL HIGH (ref 0.0–5.2)

## 2021-06-22 LAB — CANCER ANTIGEN 19-9: CA 19-9: 21 unit/mL (ref 0–35)

## 2021-06-25 ENCOUNTER — Inpatient Hospital Stay: Payer: PRIVATE HEALTH INSURANCE

## 2021-07-02 ENCOUNTER — Inpatient Hospital Stay: Payer: PRIVATE HEALTH INSURANCE

## 2021-07-02 LAB — POCT GLUCOSE: POC Glucose: 140 mg/dL — ABNORMAL HIGH (ref 65.0–110.0)

## 2021-07-02 MED ORDER — NORMAL SALINE FLUSH 0.9 % IV SOLN
0.9 % | Freq: Two times a day (BID) | INTRAVENOUS | Status: DC
Start: 2021-07-02 — End: 2021-07-02

## 2021-07-02 MED ORDER — LIDOCAINE HCL (PF) 1 % IJ SOLN
1 % | Freq: Once | INTRAMUSCULAR | Status: AC | PRN
Start: 2021-07-02 — End: 2021-07-02

## 2021-07-02 MED ORDER — SODIUM CHLORIDE 0.9 % IV SOLN
0.9 % | INTRAVENOUS | Status: DC | PRN
Start: 2021-07-02 — End: 2021-07-02

## 2021-07-02 MED ORDER — LACTATED RINGERS IV SOLN
INTRAVENOUS | Status: DC | PRN
Start: 2021-07-02 — End: 2021-07-02
  Administered 2021-07-02: 20:00:00 via INTRAVENOUS

## 2021-07-02 MED ORDER — PROPOFOL 200 MG/20ML IV EMUL
200 MG/20ML | INTRAVENOUS | Status: AC
Start: 2021-07-02 — End: ?

## 2021-07-02 MED ORDER — NORMAL SALINE FLUSH 0.9 % IV SOLN
0.9 % | INTRAVENOUS | Status: DC | PRN
Start: 2021-07-02 — End: 2021-07-02

## 2021-07-02 MED ORDER — LACTATED RINGERS IV SOLN
INTRAVENOUS | Status: DC
Start: 2021-07-02 — End: 2021-07-02

## 2021-07-02 MED ORDER — PROPOFOL 200 MG/20ML IV EMUL
200 MG/20ML | INTRAVENOUS | Status: DC | PRN
Start: 2021-07-02 — End: 2021-07-02
  Administered 2021-07-02 (×3): 200 via INTRAVENOUS

## 2021-07-02 MED ORDER — LIDOCAINE HCL (PF) 1 % IJ SOLN
1 % | INTRAMUSCULAR | Status: AC
Start: 2021-07-02 — End: 2021-07-02
  Administered 2021-07-02: 19:00:00 0.3 via INTRADERMAL

## 2021-07-02 MED ORDER — CIPROFLOXACIN IN D5W 400 MG/200ML IV SOLN
400 MG/200ML | INTRAVENOUS | Status: DC | PRN
Start: 2021-07-02 — End: 2021-07-02
  Administered 2021-07-02: 20:00:00 400 via INTRAVENOUS

## 2021-07-02 MED ORDER — CIPROFLOXACIN IN D5W 400 MG/200ML IV SOLN
400 MG/200ML | INTRAVENOUS | Status: AC
Start: 2021-07-02 — End: 2021-07-02

## 2021-07-02 MED ORDER — LIDOCAINE HCL (PF) 2 % IJ SOLN
2 % | INTRAMUSCULAR | Status: DC | PRN
Start: 2021-07-02 — End: 2021-07-02
  Administered 2021-07-02: 20:00:00 100 via INTRAVENOUS

## 2021-07-02 MED ORDER — LIDOCAINE HCL (PF) 2 % IJ SOLN
2 % | INTRAMUSCULAR | Status: AC
Start: 2021-07-02 — End: ?

## 2021-07-02 MED ORDER — LACTATED RINGERS IV SOLN
INTRAVENOUS | Status: DC
Start: 2021-07-02 — End: 2021-07-02
  Administered 2021-07-02: 19:00:00 via INTRAVENOUS

## 2021-07-02 MED FILL — DIPRIVAN 200 MG/20ML IV EMUL: 200 MG/20ML | INTRAVENOUS | Qty: 20

## 2021-07-02 MED FILL — LACTATED RINGERS IV SOLN: INTRAVENOUS | Qty: 500

## 2021-07-02 MED FILL — CIPROFLOXACIN IN D5W 400 MG/200ML IV SOLN: 400 MG/200ML | INTRAVENOUS | Qty: 200

## 2021-07-02 MED FILL — LIDOCAINE HCL (PF) 2 % IJ SOLN: 2 % | INTRAMUSCULAR | Qty: 5

## 2021-07-02 MED FILL — XYLOCAINE-MPF 1 % IJ SOLN: 1 % | INTRAMUSCULAR | Qty: 2

## 2021-07-02 MED FILL — DIPRIVAN 200 MG/20ML IV EMUL: 200 MG/20ML | INTRAVENOUS | Qty: 40

## 2021-07-02 NOTE — Anesthesia Post-Procedure Evaluation (Signed)
Department of Anesthesiology  Postprocedure Note    Patient: Brady Castro  MRN: [id number]  Birthdate: Nov 30, 1955  Date of evaluation: 07/02/2021      Procedure Summary     Date: 07/02/21 Room / Location: [facility] 01 / [facility]    Anesthesia Start: 1447 Anesthesia Stop: 1522    Procedure: ENDOSCOPIC ULTRASOUND Diagnosis:       Abnormal findings on diagnostic imaging of abdomen      Pancreatic cancer ([place])      Personal history of colonic polyps      (Abnormal findings on diagnostic imaging of abdomen [R93.5])      (Pancreatic cancer ([place]) [C25.9])      (Personal history of colonic polyps [Z86.010])    Surgeons: Laurence Ferrari, MD Responsible Provider: Haynes Dage, MD    Anesthesia Type: TIVA, General ASA Status: 3          Anesthesia Type: TIVA, General    Aldrete Phase I:      Aldrete Phase II: Aldrete Score: 8      Anesthesia Post Evaluation    Patient location during evaluation: bedside  Patient participation: complete - patient participated  Level of consciousness: awake and alert  Pain score: 0  Airway patency: patent  Nausea & Vomiting: no nausea and no vomiting  Complications: no  Cardiovascular status: hemodynamically stable  Respiratory status: room air and spontaneous ventilation  Hydration status: euvolemic  Comments: Vital Signs Stable

## 2021-07-02 NOTE — Anesthesia Pre-Procedure Evaluation (Signed)
Department of Anesthesiology  Preprocedure Note       Name:  Brady Castro   Age:  64 y.o.  DOB:  [birth year]                                          MRN:  [id number]         Date:  07/02/2021      Surgeon: Juliann Mule):  Laurence Ferrari, MD    Procedure: Procedure(s):  ENDOSCOPIC ULTRASOUND    Medications prior to admission:   Prior to Admission medications    Medication Sig Start Date End Date Taking? Authorizing Provider   amLODIPine (NORVASC) 5 MG tablet Take 1 tablet by mouth daily 08/08/19   Historical Provider, MD   atorvastatin (LIPITOR) 20 MG tablet Take 1 tablet by mouth at bedtime 08/08/19   Historical Provider, MD   TRULICITY 3 TG/6.2IR SOPN Inject 3 mg into the skin once a week SATURDAYS 05/19/21   Historical Provider, MD   glipiZIDE (GLUCOTROL) 10 MG tablet Take 1 tablet by mouth in the morning and at bedtime 09/28/19   Historical Provider, MD   levothyroxine (SYNTHROID) 200 MCG tablet Take 1 tablet by mouth daily 07/29/19   Historical Provider, MD   loratadine (CLARITIN) 10 MG tablet Take 1 tablet by mouth daily 10/13/19   Historical Provider, MD   losartan (COZAAR) 50 MG tablet Take 1 tablet by mouth at bedtime 08/08/19   Historical Provider, MD   metFORMIN (GLUCOPHAGE) 1000 MG tablet Take 1 tablet by mouth in the morning and at bedtime    Historical Provider, MD   Potassium Citrate ER (UROCIT-K) 15 MEQ (1620 MG) TBCR extended release tablet Take 1 tablet by mouth in the morning and at bedtime 05/23/21   Historical Provider, MD   APPLE CIDER VINEGAR PO Take 1 tablet by mouth daily    Historical Provider, MD       Current medications:    Current Facility-Administered Medications   Medication Dose Route Frequency Provider Last Rate Last Admin   ??? lactated ringers infusion   IntraVENous Continuous Neven Hadzijahic, MD       ??? sodium chloride flush 0.9 % injection 5-40 mL  5-40 mL IntraVENous 2 times per day Neven Hadzijahic, MD       ??? sodium chloride flush 0.9 % injection 5-40 mL  5-40 mL IntraVENous PRN Neven  Hadzijahic, MD       ??? 0.9 % sodium chloride infusion  25 mL IntraVENous PRN Neven Hadzijahic, MD       ??? sodium chloride flush 0.9 % injection 5-40 mL  5-40 mL IntraVENous 2 times per day Neven Hadzijahic, MD       ??? sodium chloride flush 0.9 % injection 5-40 mL  5-40 mL IntraVENous PRN Neven Hadzijahic, MD       ??? 0.9 % sodium chloride infusion   IntraVENous PRN Laurence Ferrari, MD       ??? lactated ringers infusion   IntraVENous Continuous Laurence Ferrari, MD       ??? lidocaine PF 1 % injection 1 mL  1 mL IntraDERmal Once PRN Laurence Ferrari, MD       ??? lidocaine PF 1 % injection                Allergies:  No Known Allergies    Problem List:    Patient Active  Problem List   Diagnosis Code   ??? Pancreatic mass K86.89   ??? Kidney stone N20.0   ??? History of prostate cancer Z85.46   ??? Tobacco dependence due to cigarettes F17.210   ??? 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 Abnormal findings on diagnostic imaging of abdomen R93.5   ??? Personal history of colonic polyps Z86.010       Past Medical History:        Diagnosis Date   ??? Arthritis    ??? Diabetes mellitus ([place])     Type II   ??? Hyperlipidemia    ??? Hypertension    ??? Hypothyroidism    ??? Kidney stones    ??? Osteoarthritis    ??? Prostate cancer ([place]) 2017   ??? Seasonal allergies    ??? Sleep apnea with use of continuous positive airway pressure (CPAP)        Past Surgical History:        Procedure Laterality Date   ??? CHOLECYSTECTOMY  2002   ??? COLONOSCOPY     ??? HERNIA REPAIR  2015   ??? KNEE ARTHROSCOPY Right 1997    repeat in 2013   ??? PROSTATE BIOPSY  2017   ??? ROTATOR CUFF REPAIR Left 1998   ??? THYROID SURGERY  2015   ??? TONSILLECTOMY  2015       Social History:    Social History     Tobacco Use   ??? Smoking status: Every Day     Packs/day: 1.50     Years: 44.00     Pack years: 66.00     Types: Cigarettes   ??? Smokeless tobacco: Never   Substance Use Topics   ??? Alcohol use: Never                                Ready to quit: Not Answered  Counseling given: Not  Answered      Vital Signs (Current):   Vitals:    07/02/21 1327   BP: (!) 164/77   Pulse: 77   Resp: 20   Temp: 97.5 ??F (36.4 ??C)   TempSrc: Oral   SpO2: 97%   Weight: (!) 317 lb 5.6 oz (143.9 kg)   Height: 5\' 7"  (1.702 m)                                              BP Readings from Last 3 Encounters:   07/02/21 (!) 164/77       NPO Status: Time of last liquid consumption: 0000                        Time of last solid consumption: 0000                        Date of last liquid consumption: 07/02/21                        Date of last solid food consumption: 07/02/21    BMI:   Wt Readings from Last 3 Encounters:   07/02/21 (!) 317 lb 5.6 oz (143.9 kg)     Body mass index is 49.7 kg/m??.    CBC: No results found for: WBC,  RBC, HGB, HCT, MCV, RDW, PLT    CMP:   Lab Results   Component Value Date/Time    K 5.1 03/09/2021 09:33 AM    PROT 6.8 06/14/2021 02:18 PM    BILITOT 0.30 06/14/2021 02:18 PM    ALKPHOS 139 06/14/2021 02:18 PM    AST 19 06/14/2021 02:18 PM    ALT 28 06/14/2021 02:18 PM       POC Tests: No results for input(s): POCGLU, POCNA, POCK, POCCL, POCBUN, POCHEMO, POCHCT in the last 72 hours.    Coags: No results found for: PROTIME, INR, APTT    HCG (If Applicable): No results found for: PREGTESTUR, PREGSERUM, HCG, HCGQUANT     ABGs: No results found for: PHART, PO2ART, PCO2ART, HCO3ART, BEART, O2SATART     Type & Screen (If Applicable):  No results found for: LABABO, LABRH    Drug/Infectious Status (If Applicable):  No results found for: HIV, HEPCAB    COVID-19 Screening (If Applicable): No results found for: COVID19        Anesthesia Evaluation    Airway: Mallampati: III  TM distance: <3 FB   Neck ROM: limited  Mouth opening: > = 3 FB   Dental: normal exam     Comment: Crown that is secure    Pulmonary: breath sounds clear to auscultation  (+) sleep apnea: on CPAP,  current smoker          Patient did not smoke on day of surgery.                 Cardiovascular:    (+) hypertension: moderate and no  interval change, hyperlipidemia        Rhythm: regular  Rate: normal                    Neuro/Psych:   Negative Neuro/Psych ROS              GI/Hepatic/Renal:   (+) renal disease: kidney stones,           Endo/Other:    (+) Diabetesno interval change, , hypothyroidism::., .                 Abdominal:             Vascular: negative vascular ROS.         Other Findings:           Anesthesia Plan      general and TIVA     ASA 3       Induction: intravenous.      Anesthetic plan and risks discussed with patient.      Plan discussed with CRNA.    Attending anesthesiologist reviewed and agrees with Preprocedure content                Haynes Dage, MD   07/02/2021

## 2021-07-02 NOTE — Procedures (Signed)
[facility]  PROCEDURE NOTE    Name:  Brady Castro, Brady Castro  DOB:  [birth year]  ACCOUNT #:  [id number]  DATE OF SERVICE:  07/02/2021    Patient of Dr. Fay Records and Dr. Beverly Sessions.    PROCEDURE:  Upper endoscopy with endoscopic ultrasound-guided fine needle aspiration.    SURGEON:  Laurence Ferrari, MD    INDICATION:  Cystic neoplasm in the uncinate process of the pancreas with slight enlargement compared to the previous imaging.    MEDICATIONS:  Anesthesia, Cipro 500 mg IV.    DESCRIPTION:  After adequate sedation, a linear echoendoscope was easily inserted through the upper esophageal sphincter and then advanced to the esophagus, lower esophageal sphincter, stomach, and pylorus into the duodenum.  The gastroduodenal mucosa was normal.  Endoscopic ultrasound was used to image the pancreas.  Pancreatic body and tail were seen from the gastric body, head of the pancreas, from the duodenal bulb, and the uncinate process from the second portion of the duodenum.  In the uncinate process of the pancreas, there was a bilobular and septated cyst measuring 19 x 17 mm without solid components.  Fine needle aspiration was performed and 2 mL of clear fluid were obtained for cytology analysis and CEA level.  The rest of the pancreas appeared normal with normal size and regular main pancreatic duct.    IMPRESSION:  Bilobulated and septated cyst in the uncinate process of the pancreas status post fine needle aspiration.    PLAN:  Follow up results of cytology report and CEA level from the cystic fluid.  Further recommendations per Dr. Rozanna Box.        Laurence Ferrari, MD    [id number]  D:  07/02/2021 15:27  T:  07/03/2021 2:25  JOB #:  [id number]

## 2021-07-02 NOTE — H&P (Signed)
HISTORY AND PHYSICAL             Date: 07/02/2021        Patient Name: Brady Castro     Date of Birth: 23-Aug-1955      Age:  65 y.o.    Chief Complaint   Pancreatic cystic neoplasm      History Obtained From   patient    History of Present Illness   See the chief complaint    Past Medical History     Past Medical History:   Diagnosis Date    Arthritis     Diabetes mellitus ([place])     Type II    Hyperlipidemia     Hypertension     Hypothyroidism     Kidney stones     Osteoarthritis     Prostate cancer ([place]) 2017    Seasonal allergies     Sleep apnea with use of continuous positive airway pressure (CPAP)         Past Surgical History     Past Surgical History:   Procedure Laterality Date    CHOLECYSTECTOMY  2002    COLONOSCOPY      HERNIA REPAIR  2015    KNEE ARTHROSCOPY Right 1997    repeat in 2013    PROSTATE BIOPSY  2017    [place]    THYROID SURGERY  2015    TONSILLECTOMY  2015        Medications Prior to Admission     Prior to Admission medications    Medication Sig Start Date End Date Taking? Authorizing Provider   amLODIPine (NORVASC) 5 MG tablet Take 1 tablet by mouth daily 08/08/19   Historical Provider, MD   atorvastatin (LIPITOR) 20 MG tablet Take 1 tablet by mouth at bedtime 08/08/19   Historical Provider, MD   TRULICITY 3 OV/5.6EP SOPN Inject 3 mg into the skin once a week SATURDAYS 05/19/21   Historical Provider, MD   glipiZIDE (GLUCOTROL) 10 MG tablet Take 1 tablet by mouth in the morning and at bedtime 09/28/19   Historical Provider, MD   levothyroxine (SYNTHROID) 200 MCG tablet Take 1 tablet by mouth daily 07/29/19   Historical Provider, MD   loratadine (CLARITIN) 10 MG tablet Take 1 tablet by mouth daily 10/13/19   Historical Provider, MD   losartan (COZAAR) 50 MG tablet Take 1 tablet by mouth at bedtime 08/08/19   Historical Provider, MD   metFORMIN (GLUCOPHAGE) 1000 MG tablet Take 1 tablet by mouth in the morning and at bedtime    Historical Provider, MD   Potassium Citrate ER  (UROCIT-K) 15 MEQ (1620 MG) TBCR extended release tablet Take 1 tablet by mouth in the morning and at bedtime 05/23/21   Historical Provider, MD   APPLE CIDER VINEGAR PO Take 1 tablet by mouth daily    Historical Provider, MD        Allergies   Patient has no known allergies.    Social History     Social History       Tobacco History       Smoking Status  Never      Smokeless Tobacco Use  Never              Alcohol History       Alcohol Use Status  Never              Drug Use       Drug Use  Status  Never              Sexual Activity       Sexually Active  Not Asked                    Family History     Family History   Problem Relation Age of Onset    Breast Cancer Mother     Brain Cancer Father     Breast Cancer Sister     Breast Cancer Sister     Cervical Cancer Sister     Melanoma Sister     Thyroid Cancer Sister        Review of Systems   Review of Systems  As above  Physical Exam   There were no vitals taken for this visit.    Physical Exam  NAD, chest clear, abdomen benign    Labs    No results found for this or any previous visit (from the past 24 hour(s)).     Imaging/Diagnostics Last 24 Hours   No results found.    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 Unknown    Overview Signed 06/21/2021 11:52 AM by Johney Maine     Added automatically from request for surgery [id number]         Abnormal findings on diagnostic imaging of abdomen 07/01/2021 Unknown    Overview Signed 07/01/2021  3:54 PM by Estanislado Pandy     Added automatically from request for surgery [id number]         Personal history of colonic polyps 07/01/2021 Unknown    Overview Signed 07/01/2021  3:54 PM by Estanislado Pandy     Added automatically from request for surgery [id number]            Plan   Endoscopic ultrasound with fine-needle aspiration    Consultations Ordered:  None    Electronically signed by Laurence Ferrari, MD on 05/28/21 at 7:54 AM EST

## 2021-07-10 ENCOUNTER — Encounter

## 2021-07-11 ENCOUNTER — Encounter

## 2021-07-11 ENCOUNTER — Ambulatory Visit: Admit: 2021-07-11 | Payer: PRIVATE HEALTH INSURANCE | Attending: Hematology & Oncology | Primary: Internal Medicine

## 2021-07-11 ENCOUNTER — Ambulatory Visit: Admit: 2021-07-11 | Payer: PRIVATE HEALTH INSURANCE | Primary: Internal Medicine

## 2021-07-11 DIAGNOSIS — K8689 Other specified diseases of pancreas: Secondary | ICD-10-CM

## 2021-07-11 NOTE — Progress Notes (Signed)
Progress Note -- Hematology/ Medical Oncology    Patient Name: Brady Castro Attending: Pilar Plate, MD   DOB: 06/17/1956  Age:65 y.o. 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  He underwent treatment with brachytherapy and pelvic radiation followed by androgen deprivation therapy.  Treatment was completed in [place], [place].     April 2022 the patient relocated to [place], [place].  PSA on initial check with Dr. Luretha Rued showed a PSA of 0.27 which had increased from undetectable levels on the prior evaluation.  Due to biochemical recurrence he was scheduled to undergo imaging.     10/23/2020 CT abdomen and pelvis with IV contrast only shows a 1.0 cm solid pulmonary nodule partially visualized in the right middle lobe. There is a questionable 5 mm solid pulmonary nodule in the left lower lobe.  Liver is enlarged measuring 22 cm.  Gallbladder is surgically absent. The pancreas shows a questionable ill-defined hypoattenuating mass within the uncinate process measuring 2.8 x 1.9 x 2.3 cm.  The left adrenal gland is normal.  There is an indeterminate oval 2.3 cm mass in the right adrenal gland.  There are numerous cysts throughout both kidneys measuring up to 3.7 cm on the left and 2.5 cm on the right. There are non-obstructing stones bilaterally measuring up to 2.0 cm on the left and 1.1 cm on the right.  There is mild left sided hydronephrosis and proximal hydroureter secondary to a 6 mm stone in the mid-left ureter.  The urinary bladder is normal in appearance.  There is no lymphadenopathy.  There are multiple radiation beads within the non-enlarged prostate gland.  The bones show no acute fracture, dislocation or destructive osseous lesion.  Soft tissue is normal.  MRI was recommended to evaluate a right adrenal lesion as well as the indeterminate  pancreatic lesion.    10/23/20 whole body bone scan shows no evidence of bony metastatic disease.   11/01/20 PSA 0.2, CA 19-9 17  11/09/2020 MRCP abdomen shows a 1.7 cm multiseptated cystic lesion in the uncinate process of the pancreas.  No enhancement.  No ductal dilatation.  No biliary dilatation.  Normal signal of the liver parenchyma.  Prior cholecystectomy.  Spleen, abdominal aorta, and bowel loops appear unremarkable.  Mild left hydronephrosis and hydroureter related to the left ureteral stone seen on 10/23/2020 CT.  Benign right adrenal adenoma measuring 2.3 cm.  Left adrenal is normal.  No lymphadenopathy.  11/26/2020 CT A/P without contrast shows a stable 3 mm nodule in the periphery of the left lower lobe.  2.5 x 1.5 cm right adrenal nodule consistent with adenoma.  10 x 7 mm calculus in the distal right ureter.  Proximal portion of the right ureter is moderately to markedly distended.  Moderate right hydronephrosis.  Right perinephric stranding or proximal periureteral rec stranding is present.  Nonobstructing bilateral nephroliths, largest in the left midpole kidney.  Back calculus has a staghorn configuration.  Prostate gland radiation implant seeds.  11/27/2020 CT chest bilateral pulmonary nodules unchanged compared with 2018.  06/05/2021 MRCP abdomen shows a multilocular cystic lesion within the uncinate process, increased in size now measuring 2.2 x 2.1 cm.  Endoscopic ultrasound suggested.  No focal hepatic lesions.  07/02/2021 EUS pathology shows proteinaceous debris and rare benign ductal epithelium  Chief Complaint  Follow up  Pancreatic mass  History of Present Illness   Brady Castro is a 65 y.o. male who returns for 17-month follow-up of a cystic pancreatic mass.  He had EUS with Dr. Jeannetta Swendsen on 07/02/2021.  He is here to review the results today but patient is already aware that the results were benign.  He denies abdominal discomfort, fever and chills.  Wheezing was noted on auscultation on exam  today.  Patient reports slightly worsening dyspnea but denies cough. He continues to smoke daily.   Review of Systems   10 point ROS as per history of present illness. All others reviewed and negative.    Past Medical History         Diagnosis Date    Arthritis     Diabetes mellitus ([place])     Type II    Hyperlipidemia     Hypertension     Hypothyroidism     Kidney stones     Osteoarthritis     Prostate cancer ([place]) 2017    Seasonal allergies     Sleep apnea with use of continuous positive airway pressure (CPAP)         Past Surgical History         Procedure Laterality Date    CHOLECYSTECTOMY  2002    COLONOSCOPY      HERNIA REPAIR  2015    KNEE ARTHROSCOPY Right 1997    repeat in 2013    PROSTATE BIOPSY  2017    [place]    THYROID SURGERY  2015    TONSILLECTOMY  2015    UPPER GASTROINTESTINAL ENDOSCOPY N/A 07/02/2021    ENDOSCOPIC ULTRASOUND performed by Laurence Ferrari, MD at [facility] ENDOSCOPY        Social History    reports that he has been smoking cigarettes. He has a 66.00 pack-year smoking history. He has never used smokeless tobacco. He reports that he does not drink alcohol and does not use drugs.     Family History         Problem Relation Age of Onset    Breast Cancer Mother     Brain Cancer Father     Breast Cancer Sister     Breast Cancer Sister     Cervical Cancer Sister     Melanoma Sister     Thyroid Cancer Sister         Allergies   No Known Allergies   Medications     Current Outpatient Medications   Medication Sig Dispense Refill    valsartan (DIOVAN) 160 MG tablet Take 160 mg by mouth      acetaminophen (TYLENOL) 500 MG tablet Take 1,000 mg by mouth every 6 hours as needed      amLODIPine (NORVASC) 5 MG tablet Take 1 tablet by mouth daily      atorvastatin (LIPITOR) 20 MG tablet Take 1 tablet by mouth at bedtime      TRULICITY 3 ZO/1.0RU SOPN Inject 3 mg into the skin once a week SATURDAYS      glipiZIDE (GLUCOTROL) 10 MG tablet Take 1 tablet by mouth in the morning and at  bedtime      levothyroxine (SYNTHROID) 200 MCG tablet Take 1 tablet by mouth daily      loratadine (CLARITIN) 10 MG tablet Take 1 tablet by mouth daily      losartan (COZAAR) 50 MG tablet Take 1 tablet by mouth at bedtime  metFORMIN (GLUCOPHAGE) 1000 MG tablet Take 1 tablet by mouth in the morning and at bedtime      Potassium Citrate ER (UROCIT-K) 15 MEQ (1620 MG) TBCR extended release tablet Take 1 tablet by mouth in the morning and at bedtime      APPLE CIDER VINEGAR PO Take 1 tablet by mouth daily       No current facility-administered medications for this visit.     Physical Exam     Vitals:    07/11/21 1006   BP: (!) 145/75   Pulse: 82   Resp: 20   Temp: 97.1 ??F (36.2 ??C)   Weight: (!) 318 lb 6.4 oz (144.4 kg)      General: No acute distress obese  HEENT:Normocephalic, EOMI, PERRL, external ears and nose without abnormality, wearing a nose strip  Neck: No tenderness, No palpable mass  Respiratory: Bilateral lung fields with expiratory wheezing throughout. No accessory muscle use.  Cardiovascular: Regular rate and rhythm. No murmurs. No peripheral edema.  Abdomen: Normal active bowel sounds. Soft. No tenderness. No palpable mass.   Lymph nodes: No cervical adenopathy.  Skin: No rash. No petechiae or purpura. No cutaneous nodules.   Musculoskeletal: No bony tenderness. No deformities.   Neurological: No sensory or motor deficit. AAO x 3.   Psychiatric: Mood appropriate for situation. Appropriate judgement and insight.    LABS AND IMAGING:   CBC and BMP reviewed.  Scanned in chart.     ASSESSMENT AND PLAN:   Cystic pancreatic mass: EUS completed on 07/02/2021 with fine-needle aspiration of pancreatic cyst.  Results benign.  A copy of the report was provided to patient.    Prostate cancer: Managed by Dr. Glennon Mac. He asked [place] to check PSA, we added this to lab today.  Results pending.    Tobacco dependence: He was counseled on the importance of tobacco cessation again today.     Return in about 6 months (around  01/09/2022) for office visit, labs.  Orders Placed This Encounter   Procedures    Total PSA With Free PSA Reflex    Hepatic Function Panel    Basic Metabolic Panel    CBC with Auto Differential    Total PSA With Free PSA Reflex     No orders of the defined types were placed in this encounter.            Sharmon Leyden, APRN - NP   [facility]  [place]  [phone]

## 2021-07-12 LAB — TOTAL PSA WITH FREE PSA REFLEX: PSA, TOTAL: 1.42 ng/mL (ref 0.000–4.000)

## 2022-01-02 NOTE — Progress Notes (Signed)
Pre Procedure Patient Instructions    Procedure Location hospital:[facility]: [address] [place] off at Outpatient Services to the left of the main hospital entrance and proceed to the gold elevator on your left (past the security desk) to the 2nd floor Surgery Reception desk.   Procedure Date 01/09/22  Arrival Time  1400      Medications:  Medication to be taken the morning of surgery with a few sips of water only: [place] or reduce the dosage of the following medication, as discussed: NONE      Stop all supplements, vitamins and herbal remedies one week prior to your procedure, unless your doctor told you to continue taking.  Do not take over the counter pain medications except plain Tylenol or Acetaminophen unless your doctor told you to do so.  If you are taking blood thinners, call the doctor performing your procedure and prescribing physician for instructions on when to stop.    Procedure Preparation    Diet Restrictions:Clear liquids ONLY the day before surgery and no food or drink including gum or mints after midnight on day of surgery    Skin Preparation:   Wash with Hibiclens or an antibacterial soap (e.g. Dial soap) the night before and morning of procedure.  Do not put on any deodorants, lotions, powders, or oils afterwards.  Be sure to put on clean clothes    Other Preparation:  Call your surgeon right away if you get any wounds, cuts, scrapes, scabs, rashes, bug bites at or near your operative site or if you have any fever, cold or flu symptoms.  Bowel prep as instructed by your doctor  No test required before surgery      Day of Procedure Patient Instruction:  Do not smoke, vape, chew tobacco, drink alcohol or use recreational drugs on the day of your procedure  Remove all jewelry, piercings, and metal accessories  Do not wear contacts, tampons, make-up, lotions, creams, powders, fragrances or deodorant  Do not bring valuables or  money  Bring a copy of your Living Will and/or [place] if you have one  Bring a list of current medications including name and dosage  Bring a picture ID and insurance card and any of the following that are applicable to you:     Inhalers      CPAP or BiPAP machine     Remote for spinal cord stimulator or other implanted device     Insulin pump supplies     Walker or other orthopedic device necessary for postop     Storage case for eyeglasses, hearing aids, dentures, etc     A loose button-up shirt if instructed          .  If you are going home the same day as your procedure, a support person should accompany you to the facility and must transport you home.  If you plan to take public transportation of any sort, your support person must accompany you home.  You will need someone to stay with you for 24 hours after your procedure with sedation of any kind.         No COVID testing required as discussed.       CommentsDiona Browner    The information and visitor policy was reviewed with you during your Pre-Admission Testing interview and you verbalized understanding. If you have any additional questions please contact [phone]  For financial questions regarding your procedure at a [facility] facility, please contact [phone], option 1    For financial questions regarding anesthesia at a [facility] facility, please  contact [phone]

## 2022-01-09 ENCOUNTER — Inpatient Hospital Stay: Payer: BLUE CROSS/BLUE SHIELD | Attending: Gastroenterology

## 2022-01-09 LAB — POCT GLUCOSE: POC Glucose: 132 mg/dL — ABNORMAL HIGH (ref 65.0–110.0)

## 2022-01-09 MED ORDER — SODIUM CHLORIDE 0.9 % IV SOLN
0.9 % | INTRAVENOUS | Status: DC | PRN
Start: 2022-01-09 — End: 2022-01-09

## 2022-01-09 MED ORDER — LACTATED RINGERS IV SOLN
INTRAVENOUS | Status: DC
Start: 2022-01-09 — End: 2022-01-09

## 2022-01-09 MED ORDER — NORMAL SALINE FLUSH 0.9 % IV SOLN
0.9 % | Freq: Two times a day (BID) | INTRAVENOUS | Status: DC
Start: 2022-01-09 — End: 2022-01-09

## 2022-01-09 MED ORDER — PROPOFOL 200 MG/20ML IV EMUL
200 MG/20ML | INTRAVENOUS | Status: AC
Start: 2022-01-09 — End: ?

## 2022-01-09 MED ORDER — LACTATED RINGERS IV SOLN
INTRAVENOUS | Status: DC | PRN
Start: 2022-01-09 — End: 2022-01-09
  Administered 2022-01-09: 19:00:00 via INTRAVENOUS

## 2022-01-09 MED ORDER — LIDOCAINE HCL (PF) 2 % IJ SOLN
2 % | INTRAMUSCULAR | Status: AC
Start: 2022-01-09 — End: ?

## 2022-01-09 MED ORDER — SODIUM CHLORIDE 0.9 % IV SOLN
0.9 % | INTRAVENOUS | Status: DC
Start: 2022-01-09 — End: 2022-01-09

## 2022-01-09 MED ORDER — ONDANSETRON HCL 4 MG/2ML IJ SOLN
4 MG/2ML | Freq: Once | INTRAMUSCULAR | Status: DC | PRN
Start: 2022-01-09 — End: 2022-01-09

## 2022-01-09 MED ORDER — LIDOCAINE HCL (PF) 2 % IJ SOLN
2 % | INTRAMUSCULAR | Status: DC | PRN
Start: 2022-01-09 — End: 2022-01-09
  Administered 2022-01-09: 19:00:00 100 via INTRAVENOUS

## 2022-01-09 MED ORDER — LIDOCAINE HCL (PF) 1 % IJ SOLN
1 % | Freq: Once | INTRAMUSCULAR | Status: DC
Start: 2022-01-09 — End: 2022-01-09

## 2022-01-09 MED ORDER — LIDOCAINE HCL (PF) 1 % IJ SOLN
1 % | INTRAMUSCULAR | Status: AC
Start: 2022-01-09 — End: 2022-01-09
  Administered 2022-01-09: 19:00:00 0.3

## 2022-01-09 MED ORDER — NORMAL SALINE FLUSH 0.9 % IV SOLN
0.9 % | INTRAVENOUS | Status: DC | PRN
Start: 2022-01-09 — End: 2022-01-09

## 2022-01-09 MED ORDER — PROPOFOL 200 MG/20ML IV EMUL
200 MG/20ML | INTRAVENOUS | Status: DC | PRN
Start: 2022-01-09 — End: 2022-01-09
  Administered 2022-01-09: 19:00:00 550 via INTRAVENOUS

## 2022-01-09 MED ORDER — LIDOCAINE HCL (PF) 1 % IJ SOLN
1 % | Freq: Once | INTRAMUSCULAR | Status: DC | PRN
Start: 2022-01-09 — End: 2022-01-09

## 2022-01-09 MED FILL — DIPRIVAN 200 MG/20ML IV EMUL: 200 MG/20ML | INTRAVENOUS | Qty: 40

## 2022-01-09 MED FILL — XYLOCAINE-MPF 1 % IJ SOLN: 1 % | INTRAMUSCULAR | Qty: 2

## 2022-01-09 MED FILL — DIPRIVAN 200 MG/20ML IV EMUL: 200 MG/20ML | INTRAVENOUS | Qty: 20

## 2022-01-09 MED FILL — XYLOCAINE-MPF 2 % IJ SOLN: 2 % | INTRAMUSCULAR | Qty: 5

## 2022-01-09 NOTE — Anesthesia Pre-Procedure Evaluation (Signed)
Department of Anesthesiology  Preprocedure Note       Name:  Brady Castro   Age:  66 y.o.  DOB:  [birth year]                                          MRN:  [id number]         Date:  01/09/2022      Surgeon: Juliann Mule):  Michail Sermon, MD    Procedure: Procedure(s):  COLONOSCOPY DIAGNOSTIC    Medications prior to admission:   Prior to Admission medications    Medication Sig Start Date End Date Taking? Authorizing Provider   acetaminophen (TYLENOL) 500 MG tablet Take 2 tablets by mouth every 6 hours as needed 10/23/16   Historical Provider, MD   amLODIPine (NORVASC) 5 MG tablet Take 1 tablet by mouth nightly 08/08/19   Historical Provider, MD   atorvastatin (LIPITOR) 20 MG tablet Take 1 tablet by mouth at bedtime 08/08/19   Historical Provider, MD   TRULICITY 3 WU/9.8JX SOPN Inject 3 mg into the skin once a week SATURDAYS 05/19/21   Historical Provider, MD   glipiZIDE (GLUCOTROL) 10 MG tablet Take 1 tablet by mouth in the morning and at bedtime 09/28/19   Historical Provider, MD   levothyroxine (SYNTHROID) 200 MCG tablet Take 1 tablet by mouth daily 07/29/19   Historical Provider, MD   loratadine (CLARITIN) 10 MG tablet Take 1 tablet by mouth daily 10/13/19   Historical Provider, MD   losartan (COZAAR) 50 MG tablet Take 1 tablet by mouth at bedtime 08/08/19   Historical Provider, MD   metFORMIN (GLUCOPHAGE) 1000 MG tablet Take 1 tablet by mouth in the morning and at bedtime    Historical Provider, MD   Potassium Citrate ER (UROCIT-K) 15 MEQ (1620 MG) TBCR extended release tablet Take 1 tablet by mouth in the morning and at bedtime 05/23/21   Historical Provider, MD   APPLE CIDER VINEGAR PO Take 1 tablet by mouth daily    Historical Provider, MD       Current medications:    No current facility-administered medications for this encounter.       Allergies:  No Known Allergies    Problem List:    Patient Active Problem List   Diagnosis Code   . Pancreatic mass K86.89   . Kidney stone N20.0   . History of prostate cancer Z85.46   .  Tobacco dependence due to cigarettes F17.210   . 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 Abnormal findings on diagnostic imaging of abdomen R93.5   . Personal history of colonic polyps Z86.010   . 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 Morbid obesity ([place]) E66.01       Past Medical History:        Diagnosis Date   . Arthritis    . Diabetes mellitus ([place])     Type II BS AT HOME 100-200   . Hyperlipidemia    . Hypertension    . Hypothyroidism    . Kidney stones    . Lower back pain    . Osteoarthritis    . Prostate cancer ([place]) 2017    DR JETER  RADATION AND SEED THERAPY   . Screening for colon cancer    . Seasonal allergies    . Sleep apnea with use of continuous  positive airway pressure (CPAP)     USES CPAP MACHINE   . Wears glasses        Past Surgical History:        Procedure Laterality Date   . CHOLECYSTECTOMY  2002   . COLONOSCOPY     . HERNIA REPAIR  2015   . KNEE ARTHROSCOPY Right 1997    repeat in 2013   . PROSTATE BIOPSY  2017   . ROTATOR CUFF REPAIR Left 1998   . THYROID SURGERY  2015   . TONSILLECTOMY  2015   . UPPER GASTROINTESTINAL ENDOSCOPY N/A 07/02/2021    ENDOSCOPIC ULTRASOUND performed by Laurence Ferrari, MD at [facility] ENDOSCOPY       Social History:    Social History     Tobacco Use   . Smoking status: Every Day     Packs/day: 1.00     Years: 44.00     Pack years: 44.00     Types: Cigarettes   . Smokeless tobacco: Never   Substance Use Topics   . Alcohol use: Never                                Ready to quit: Not Answered  Counseling given: Not Answered      Vital Signs (Current):   Vitals:    01/02/22 0954   Weight: (!) 315 lb (142.9 kg)   Height: '5\' 7"'$  (1.702 m)                                              BP Readings from Last 3 Encounters:   07/11/21 (!) 145/75   07/02/21 121/72       NPO Status:                                                                                 BMI:   Wt Readings from Last 3 Encounters:   01/02/22 (!) 315 lb (142.9 kg)   07/11/21  (!) 318 lb 6.4 oz (144.4 kg)   07/02/21 (!) 317 lb 5.6 oz (143.9 kg)     Body mass index is 49.34 kg/m.    CBC: No results found for: WBC, RBC, HGB, HCT, MCV, RDW, PLT    CMP:   Lab Results   Component Value Date/Time    K 5.1 03/09/2021 09:33 AM    PROT 6.8 06/14/2021 02:18 PM    BILITOT 0.30 06/14/2021 02:18 PM    ALKPHOS 139 06/14/2021 02:18 PM    AST 19 06/14/2021 02:18 PM    ALT 28 06/14/2021 02:18 PM       POC Tests: No results for input(s): POCGLU, POCNA, POCK, POCCL, POCBUN, POCHEMO, POCHCT in the last 72 hours.    Coags: No results found for: PROTIME, INR, APTT    HCG (If Applicable): No results found for: PREGTESTUR, PREGSERUM, HCG, HCGQUANT     ABGs: No results found for: PHART, PO2ART, PCO2ART, HCO3ART, BEART, O2SATART     Type & Screen (  If Applicable):  No results found for: LABABO, LABRH    Drug/Infectious Status (If Applicable):  No results found for: HIV, HEPCAB    COVID-19 Screening (If Applicable): No results found for: COVID19        Anesthesia Evaluation    Airway: Mallampati: II          Dental:          Pulmonary:normal exam    (+) sleep apnea:                             Cardiovascular:    (+) hypertension:,                   Neuro/Psych:   Negative Neuro/Psych ROS              GI/Hepatic/Renal:             Endo/Other:    (+) Diabetes, hypothyroidism::., .                 Abdominal: normal exam            Vascular: negative vascular ROS.         Other Findings:           Anesthesia Plan      general     ASA 3             Anesthetic plan and risks discussed with patient.      Plan discussed with CRNA.    Attending anesthesiologist reviewed and agrees with Preprocedure content                Estevan Ryder, MD   01/09/2022

## 2022-01-09 NOTE — Anesthesia Post-Procedure Evaluation (Signed)
Department of Anesthesiology  Postprocedure Note    Patient: Brady Castro  MRN: [id number]  Birthdate: March 28, 1956  Date of evaluation: 01/09/2022      Procedure Summary     Date: 01/09/22 Room / Location: [facility] ENDO 01 / [facility]    Anesthesia Start: 1509 Anesthesia Stop: 1740    Procedure: COLONOSCOPY POLYPECTOMY SNARE/COLD BIOPSY Diagnosis:       Benign neoplasm of pancreas, except islets of Langerhans      Morbid obesity ([place])      Personal history of colonic polyps      (Benign neoplasm of pancreas, except islets of Langerhans [D13.6])      (Morbid obesity ([place]) [E66.01])      (Personal history of colonic polyps [Z86.010])    Surgeons: Michail Sermon, MD Responsible Provider: Estevan Ryder, MD    Anesthesia Type: TIVA ASA Status: 3          Anesthesia Type: TIVA    Aldrete Phase I:      Aldrete Phase II: Aldrete Score: 10      Anesthesia Post Evaluation    Patient location during evaluation: bedside  Patient participation: complete - patient participated  Level of consciousness: awake and alert  Pain score: 1  Airway patency: patent  Complications: no  Cardiovascular status: blood pressure returned to baseline  Respiratory status: acceptable  Hydration status: euvolemic

## 2022-01-09 NOTE — Procedures (Signed)
[facility]  PROCEDURE NOTE    Name:  Brady Castro, Brady Castro  DOB:  [birth year]  ACCOUNT #:  [id number]  DATE OF SERVICE:  01/09/2022    PROCEDURES PERFORMED:  Colonoscopy with cold and hot snare polypectomy.    SURGEON:  Michail Sermon, MD    INDICATIONS FOR PROCEDURE:  The patient is a 66 year old obese white male with a history of colon polyps, here for colon polyp surveillance.  The patient has no GI symptoms at this time.    SEDATION:  See Anesthesia record.    DESCRIPTION OF THE PROCEDURE:  The patient was placed in the left lateral decubitus position and sedation was administered by Anesthesia staff.  After adequate sedation was achieved, a GIF-190 Olympus colonoscope was inserted into the rectum via the anal canal and was advanced proximally to the cecum identified by the appendiceal orifice and ileocecal valve .  The colonoscope was then withdrawn slowly with careful examination of all mucosa.  The prep was excellent, the views were excellent.  There was noted to be a 6-mm polyp in the ascending colon, which was removed via cold snare polypectomy and placed in jar B.  There were noted to be two polyps in the transverse colon, which were removed via cold snare polypectomy and placed in jar A.    A large 2-cm pedunculated polyp was noted at the splenic flexure.  Polyp was removed using hot snare polypectomy technique and placed in jar C.  A smaller polyp just proximal to that polyp was removed via cold snare polypectomy and placed in the same jar.  There was noted to be moderate left-sided diverticulosis with no evidence of diverticulitis.  There were three smaller polyps in the sigmoid colon measuring 4 mm to 6 mm, which were removed via cold snare polypectomy.  Retroflexed view in the rectum revealed grade III internal hemorrhoids.  The patient tolerated the procedure well.  There were no immediate complications.    IMPRESSION:  1.  A 2-cm pedunculated polyp at the small splenic flexure, removed via  cold snare polypectomy and placed in jar C.  2.  Three smaller polyps in the sigmoid colon removed via cold snare polypectomy.  3.  A 6-mm ascending colon polyp removed via cold snare polypectomy and placed in jar B.  4.  Two polyps in the transverse colon removed via cold snare polypectomy and placed in jar A.  5.  Moderate left-sided diverticulosis.  6.  Grade II internal hemorrhoids.    PLAN:  1.  The patient will be discharged home and will continue home meds.  The patient will hold aspirin and NSAIDs products for five days.  2.  We will follow up biopsy results and make recommendations for surveillance colonoscopy.  3.  Weight loss and exercise emphasized.  4.  Continue home meds.        Michail Sermon, MD      [id number]  D:  01/09/2022 16:01  T:  01/10/2022 2:49  JOB #:  [id number]  CC:  Delray Alt, MD

## 2022-04-04 LAB — PSA, DIAGNOSTIC: PSA: 4.11 ng/mL — ABNORMAL HIGH (ref 0.000–4.000)

## 2022-04-14 ENCOUNTER — Encounter

## 2022-04-17 ENCOUNTER — Ambulatory Visit: Admit: 2022-04-17 | Discharge: 2022-04-17 | Payer: BLUE CROSS/BLUE SHIELD | Primary: Family

## 2022-04-17 ENCOUNTER — Ambulatory Visit
Admit: 2022-04-17 | Discharge: 2022-04-17 | Payer: BLUE CROSS/BLUE SHIELD | Attending: Hematology & Oncology | Primary: Family

## 2022-04-17 DIAGNOSIS — R972 Elevated prostate specific antigen [PSA]: Secondary | ICD-10-CM

## 2022-04-17 DIAGNOSIS — Z8546 Personal history of malignant neoplasm of prostate: Secondary | ICD-10-CM

## 2022-04-17 LAB — CBC WITH AUTO DIFFERENTIAL
Absolute Baso #: 0.1 10*3/uL (ref 0.0–0.1)
Absolute Eos #: 0.1 10*3/uL (ref 0.0–0.6)
Absolute Lymph #: 1.4 10*3/uL (ref 0.5–5.0)
Basophils %: 0.6 % (ref 0.0–1.2)
Eosinophils %: 1.7 % (ref 0.0–5.4)
Hematocrit: 49.6 % — ABNORMAL HIGH (ref 37.2–47.8)
Hemoglobin: 16.8 g/dL — ABNORMAL HIGH (ref 12.5–16.1)
Lymphocytes: 17.8 % (ref 14.8–45.8)
MCH: 33 pg (ref 27–33)
MCHC: 33.9 g/dL (ref 32.5–34.8)
MCV: 96 fL (ref 81–97)
MPV: 10.4 fL — ABNORMAL HIGH (ref 7.00–10.00)
Monocytes Absolute: 0.7 10*3/uL (ref 0.1–1.3)
Monocytes: 8.1 % (ref 4.0–12.7)
Neutrophils %: 71.8 % (ref 40.1–76.4)
Neutrophils Absolute: 5.8 10*3/uL (ref 1.3–8.4)
Platelets: 217 10*3/uL (ref 142–424)
RBC: 5.16 M/uL (ref 4.00–5.58)
RDW: 15.5 % — ABNORMAL HIGH (ref 11.6–14.8)
WBC: 8.1 10*3/uL (ref 3.2–12.0)

## 2022-04-17 LAB — BASIC METABOLIC PANEL
Anion Gap: 7.1
BUN: 19.1 mg/dl (ref 8.0–23.0)
CO2: 23.1 mmol/L (ref 22.0–29.0)
Calcium: 9.14 mg/dl (ref 8.80–10.20)
Chloride: 102.6 mmol/L (ref 98.0–107.0)
Creatinine: 1 mg/dl (ref 0.70–1.30)
Est, Glom Filt Rate: 75.22
Glucose: 349 mg/dl — ABNORMAL HIGH (ref 70.0–99.0)
Potassium: 4.09 mmol/L (ref 3.50–5.30)
Sodium: 132.8 mmol/L — ABNORMAL LOW (ref 135.0–145.0)

## 2022-04-17 LAB — HEPATIC FUNCTION PANEL
ALT: 21 U/L (ref 0–50)
AST: 12 U/L (ref 0–50)
Albumin: 4.2 g/dl (ref 3.50–5.20)
Alk Phosphatase: 121 U/L
Total Bilirubin: 0.37 mg/dl (ref 0.00–1.20)
Total Protein: 6.5 g/dL (ref 6.30–8.20)

## 2022-04-17 NOTE — Progress Notes (Signed)
Progress Note -- Hematology/ Medical Oncology    Patient Name: Brady Castro Attending: Pilar Plate, MD   DOB: [birth year]  Age:66 y.o. 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  He underwent treatment with brachytherapy and pelvic radiation followed by androgen deprivation therapy.  Treatment was completed in [place], [place].     April 2022 the patient relocated to [place], [place].  PSA on initial check with Dr. Luretha Rued showed a PSA of 0.27 which had increased from undetectable levels on the prior evaluation.  Due to biochemical recurrence he was scheduled to undergo imaging.     10/23/2020 CT abdomen and pelvis with IV contrast only shows a 1.0 cm solid pulmonary nodule partially visualized in the right middle lobe. There is a questionable 5 mm solid pulmonary nodule in the left lower lobe.  Liver is enlarged measuring 22 cm.  Gallbladder is surgically absent. The pancreas shows a questionable ill-defined hypoattenuating mass within the uncinate process measuring 2.8 x 1.9 x 2.3 cm.  The left adrenal gland is normal.  There is an indeterminate oval 2.3 cm mass in the right adrenal gland.  There are numerous cysts throughout both kidneys measuring up to 3.7 cm on the left and 2.5 cm on the right. There are non-obstructing stones bilaterally measuring up to 2.0 cm on the left and 1.1 cm on the right.  There is mild left sided hydronephrosis and proximal hydroureter secondary to a 6 mm stone in the mid-left ureter.  The urinary bladder is normal in appearance.  There is no lymphadenopathy.  There are multiple radiation beads within the non-enlarged prostate gland.  The bones show no acute fracture, dislocation or destructive osseous lesion.  Soft tissue is normal.  MRI was recommended to evaluate a right adrenal lesion as well as the indeterminate  pancreatic lesion.    10/23/20 whole body bone scan shows no evidence of bony metastatic disease.   11/01/20 PSA 0.2, CA 19-9 17  11/09/2020 MRCP abdomen shows a 1.7 cm multiseptated cystic lesion in the uncinate process of the pancreas.  No enhancement.  No ductal dilatation.  No biliary dilatation.  Normal signal of the liver parenchyma.  Prior cholecystectomy.  Spleen, abdominal aorta, and bowel loops appear unremarkable.  Mild left hydronephrosis and hydroureter related to the left ureteral stone seen on 10/23/2020 CT.  Benign right adrenal adenoma measuring 2.3 cm.  Left adrenal is normal.  No lymphadenopathy.  11/26/2020 CT A/P without contrast shows a stable 3 mm nodule in the periphery of the left lower lobe.  2.5 x 1.5 cm right adrenal nodule consistent with adenoma.  10 x 7 mm calculus in the distal right ureter.  Proximal portion of the right ureter is moderately to markedly distended.  Moderate right hydronephrosis.  Right perinephric stranding or proximal periureteral rec stranding is present.  Nonobstructing bilateral nephroliths, largest in the left midpole kidney.  Back calculus has a staghorn configuration.  Prostate gland radiation implant seeds.  11/27/2020 CT chest bilateral pulmonary nodules unchanged compared with 2018.  06/05/2021 MRCP abdomen shows a multilocular cystic lesion within the uncinate process, increased in size now measuring 2.2 x 2.1 cm.  Endoscopic ultrasound suggested.  No focal hepatic lesions.  07/02/2021 EUS pathology shows proteinaceous debris and rare benign ductal epithelium  04/07/22 PSA 4.11    Chief Complaint   Follow-up     History of Present Illness   Brady Castro is a 66 y.o. 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  He also has a history of prostate cancer which is always been managed by urologist, Dr. Leonie Green.  He was last seen in the office on 2/29/2022.  55-monthfollow-up was  requested but, for unclear reasons, he did not maintain that appointment.  He requested a follow-up now because patient PSA had increased to 4.11 on 04/07/2022.  Dr. WGlennon Machas ordered a PSMA PET and requested that he return here to discuss management of prostate cancer.  Patient reports increased back pain and pelvic pain over the last 6 months.  Patient's he discussed this with his PCP who prescribed ibuprofen.  He did not have the prescription filled as he had already been cautioned against ibuprofen use by Dr. WGlennon Mac    Review of Systems   10 point ROS as per history of present illness. All others reviewed and negative.    Past Medical History         Diagnosis Date    Hyperlipidemia     Hypertension     Hypothyroidism     Kidney stones     Lower back pain     Osteoarthritis     Prostate cancer ([facility] 2017    Urologist: Dr. SLeonie Green   Sleep apnea with use of continuous positive airway pressure (CPAP)     Type 2 diabetes mellitus ([facility]         Past Surgical History         Procedure Laterality Date    CHOLECYSTECTOMY  2002    COLONOSCOPY      COLONOSCOPY N/A 01/09/2022    COLONOSCOPY POLYPECTOMY SNARE/COLD BIOPSY [facility]    KNEE ARTHROSCOPY Right 1997    repeat in 2013    [place]    [place]SILLECTOMY  2015    UPPER GASTROINTESTINAL ENDOSCOPY N/A 07/02/2021    END[facility]COPIC [facility]    reports that he has been smoking cigarettes. He has a 44.00 pack-year smoking history. He has never used smokeless tobacco. He reports that he does not drink alcohol and does not use drugs.     Family History         Problem Relation Age of Onset    Breast Cancer Mother     Brain Cancer Father     Breast Cancer Sister     Breast Cancer Sister     Cervical Cancer Sister     Melanoma Sister     Thyroid Cancer Sister         Allergies      Allergies   Allergen Reactions    Ibuprofen      Kidney injury        Medications     Current Outpatient Medications   Medication Sig Dispense Refill    acetaminophen (TYLENOL) 500 MG tablet Take 2 tablets by mouth every 6 hours as needed      amLODIPine (NORVASC) 5 MG tablet Take 1  tablet by mouth nightly      atorvastatin (LIPITOR) 20 MG tablet Take 1 tablet by mouth at bedtime      TRULICITY 3 XH/3.7JI SOPN Inject 3 mg into the skin once a week SATURDAYS      glipiZIDE (GLUCOTROL) 10 MG tablet Take 1 tablet by mouth in the morning and at bedtime      levothyroxine (SYNTHROID) 200 MCG tablet Take 1 tablet by mouth daily      loratadine (CLARITIN) 10 MG tablet Take 1 tablet by mouth daily      losartan (COZAAR) 50 MG tablet Take 1 tablet by mouth at bedtime      metFORMIN (GLUCOPHAGE) 1000 MG tablet Take 1 tablet by mouth in the morning and at bedtime      Potassium Citrate ER (UROCIT-K) 15 MEQ (1620 MG) TBCR extended release tablet Take 1 tablet by mouth in the morning and at bedtime      APPLE CIDER VINEGAR PO Take 1 tablet by mouth daily       No current facility-administered medications for this visit.     Physical Exam     Vitals:    04/17/22 1450   BP: (!) 153/75   Pulse: 83   Resp: 18   Temp: 97.8 F (36.6 C)   SpO2: 97%   Weight: (!) 320 lb 8 oz (145.4 kg)   Height: '5\' 3"'$  (1.6 m)      General: No acute distress  HEENT:Normocephalic, EOMI, PERRL, external ears and nose without abnormality, no oral lesions  Neck: No tenderness, No palpable mass  Respiratory: Bilateral lung fields clear to auscultation. No accessory muscle use.  Cardiovascular: Regular rate and rhythm. No murmurs. No bruits. No peripheral edema.  Abdomen: Normal active bowel sounds. Soft. No tenderness. No palpable mass.   Lymph nodes: No cervical, supraclavicular, or axillary adenopathy.  Skin: No rash. No petechiae or purpura. No cutaneous nodules.   Musculoskeletal: No bony tenderness. No deformities.   Neurological: No sensory or  motor deficit. AAO x 3.   Psychiatric: Mood appropriate for situation. Appropriate judgement and insight.    LABS AND IMAGING:     Lab Results   Component Value Date/Time    WBC 8.1 04/17/2022 02:27 PM    HGB 16.8 04/17/2022 02:27 PM    HCT 49.6 04/17/2022 02:27 PM    PLT 217 04/17/2022 02:27 PM    MCV 96 04/17/2022 02:27 PM       Lab Results   Component Value Date/Time    NA 132.8 04/17/2022 02:27 PM    K 4.09 04/17/2022 02:27 PM    CL 102.6 04/17/2022 02:27 PM    CO2 23.1 04/17/2022 02:27 PM    BUN 19.1 04/17/2022 02:27 PM    ALT 21 04/17/2022 02:27 PM       No image results found.     ASSESSMENT AND PLAN:   Prostate cancer: Rising PSA concerning for recurrence and/or metastasis. PSMA PET ordered by Dr. Glennon Mac and planned for 04/29/22. I will see him again on 05/01/22 to review the results.     Cystic pancreatic mass: EUS completed on 07/02/2021 with fine-needle aspiration of pancreatic cyst.  Results benign.       Return in about 2 weeks (around 05/01/2022) for Office Visit Only.  No orders of the defined types were placed in this encounter.    No orders of the defined types were placed in this encounter.  Caryl Pina Abide Trey Sailors, [place]  [place]  [phone]

## 2022-04-18 LAB — TOTAL PSA WITH FREE PSA REFLEX: PSA, TOTAL: 3.96 ng/mL (ref 0.000–4.000)

## 2022-04-29 ENCOUNTER — Inpatient Hospital Stay: Admit: 2022-04-29 | Payer: BLUE CROSS/BLUE SHIELD | Attending: Urology | Primary: Family

## 2022-04-29 DIAGNOSIS — C61 Malignant neoplasm of prostate: Secondary | ICD-10-CM

## 2022-04-29 MED ORDER — PIFLUFOLASTAT F 18 9 MCI IV SOSY
9 MCI | Freq: Once | INTRAVENOUS | Status: AC | PRN
Start: 2022-04-29 — End: 2022-04-29
  Administered 2022-04-29: 17:00:00 9 via INTRAVENOUS

## 2022-05-01 ENCOUNTER — Ambulatory Visit
Admit: 2022-05-01 | Discharge: 2022-05-01 | Payer: BLUE CROSS/BLUE SHIELD | Attending: Hematology & Oncology | Primary: Family

## 2022-05-01 DIAGNOSIS — Z8546 Personal history of malignant neoplasm of prostate: Secondary | ICD-10-CM

## 2022-05-01 NOTE — Progress Notes (Signed)
Progress Note -- Hematology/ Medical Oncology    Patient Name: Brady Castro Attending: Pilar Plate, MD   DOB: Apr 09, 1956  Age:66 y.o. 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  He underwent treatment with brachytherapy and pelvic radiation followed by androgen deprivation therapy.  Treatment was completed in [place], [place].     April 2022 the patient relocated to [place], [place].  PSA on initial check with Dr. Luretha Rued showed a PSA of 0.27 which had increased from undetectable levels on the prior evaluation.  Due to biochemical recurrence he was scheduled to undergo imaging.     10/23/2020 CT abdomen and pelvis with IV contrast only shows a 1.0 cm solid pulmonary nodule partially visualized in the right middle lobe. There is a questionable 5 mm solid pulmonary nodule in the left lower lobe.  Liver is enlarged measuring 22 cm.  Gallbladder is surgically absent. The pancreas shows a questionable ill-defined hypoattenuating mass within the uncinate process measuring 2.8 x 1.9 x 2.3 cm.  The left adrenal gland is normal.  There is an indeterminate oval 2.3 cm mass in the right adrenal gland.  There are numerous cysts throughout both kidneys measuring up to 3.7 cm on the left and 2.5 cm on the right. There are non-obstructing stones bilaterally measuring up to 2.0 cm on the left and 1.1 cm on the right.  There is mild left sided hydronephrosis and proximal hydroureter secondary to a 6 mm stone in the mid-left ureter.  The urinary bladder is normal in appearance.  There is no lymphadenopathy.  There are multiple radiation beads within the non-enlarged prostate gland.  The bones show no acute fracture, dislocation or destructive osseous lesion.  Soft tissue is normal.  MRI was recommended to evaluate a right adrenal lesion as well as the  indeterminate pancreatic lesion.    10/23/20 whole body bone scan shows no evidence of bony metastatic disease.   11/01/20 PSA 0.2, CA 19-9 17  11/09/2020 MRCP abdomen shows a 1.7 cm multiseptated cystic lesion in the uncinate process of the pancreas.  No enhancement.  No ductal dilatation.  No biliary dilatation.  Normal signal of the liver parenchyma.  Prior cholecystectomy.  Spleen, abdominal aorta, and bowel loops appear unremarkable.  Mild left hydronephrosis and hydroureter related to the left ureteral stone seen on 10/23/2020 CT.  Benign right adrenal adenoma measuring 2.3 cm.  Left adrenal is normal.  No lymphadenopathy.  11/26/2020 CT A/P without contrast shows a stable 3 mm nodule in the periphery of the left lower lobe.  2.5 x 1.5 cm right adrenal nodule consistent with adenoma.  10 x 7 mm calculus in the distal right ureter.  Proximal portion of the right ureter is moderately to markedly distended.  Moderate right hydronephrosis.  Right perinephric stranding or proximal periureteral rec stranding is present.  Nonobstructing bilateral nephroliths, largest in the left midpole kidney.  Back calculus has a staghorn configuration.  Prostate gland radiation implant seeds.  11/27/2020 CT chest bilateral pulmonary nodules unchanged compared with 2018.  06/05/2021 MRCP abdomen shows a multilocular cystic lesion within the uncinate process, increased in size now measuring 2.2 x 2.1 cm.  Endoscopic ultrasound suggested.  No focal hepatic lesions.  07/02/2021 EUS pathology shows proteinaceous debris and rare benign ductal  epithelium  07/11/2021 PSA 1.4  04/07/2022 PSA 4.11  04/17/2022 PSA 3.9  04/29/2022 PSMA PET Low-level uniform activity within the postradiation therapy prostate, no   overt evidence of local regional recurrence. Intense activity within multiple shotty small lymph nodes extending from the   infrarenal periaortic distribution through the right common iliac distribution, also with likely involvement of  the left inguinal chain. There is separate distant involvement at the left thoracic inlet. Negative for bony metastatic disease.TNM (molecular imaging):T: miT0 N: miN1 M: miM1a  Chief Complaint   Follow-up     History of Present Illness   Brady Castro is a 66 y.o. 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  He also has a history of prostate cancer which is always been managed by urologist, Dr. Leonie Green. He returns with his daughter, Brady Castro, today.  He had a PSMA PET on 04/29/2022 as ordered by Dr. Glennon Mac. We reviewed the results today. He was very abrupt from the beginning of his visit.  Patient and his daughter tell me that he has an appointment with his urologist, Dr. Glennon Mac, next week. I explained that I reviewed the results of his PET/CT with Dr. Trey Sailors and discussed goals of care with Dr. Trey Sailors. I explained that the recommended treatment would be leuprolide and Xtandi. Patient told me that he would not rechallenge leuprolide because it made him feel poorly. His daughter brought up his complaint of ongoing back pain and pelvic pain.  Before we could discuss any further, patient interrupted [place] and stated that he did not need any further medication nor did he want any pain medication to alleviate his pain.   Review of Systems   10 point ROS as per history of present illness. All others reviewed and negative.    Past Medical History         Diagnosis Date    Hyperlipidemia     Hypertension     Hypothyroidism     Kidney stones     Lower back pain     Osteoarthritis     Prostate cancer [facility]) 2017    Urologist: Dr. Leonie Green    Sleep apnea with use of continuous positive airway pressure (CPAP)     Type 2 diabetes mellitus [facility])         Past Surgical History         Procedure Laterality Date    CHOLECYSTECTOMY  2002    COLONOSCOPY      COLONOSCOPY N/A 01/09/2022    COLONOSCOPY POLYPECTOMY SNARE/COLD BIOPSY performed by Michail Sermon, MD  at [place]  2015    KNEE ARTHROSCOPY Right 1997    repeat in 2013    PROSTATE BIOPSY  2017    [place]  2015    TONSILLECTOMY  2015    UPPER GASTROINTESTINAL ENDOSCOPY N/A 07/02/2021    ENDOSCOPIC ULTRASOUND performed by Laurence Ferrari, MD at [place] [facility]    reports that he has been smoking cigarettes. He has a 44.00 pack-year smoking history. He has never used smokeless tobacco. He reports that he does not drink alcohol and does not use drugs.     Family History         Problem Relation Age of Onset    Breast Cancer  Mother     Brain Cancer Father     Breast Cancer Sister     Breast Cancer Sister     Cervical Cancer Sister     Melanoma Sister     Thyroid Cancer Sister         Allergies     Allergies   Allergen Reactions    Ibuprofen      Kidney injury        Medications     Current Outpatient Medications   Medication Sig Dispense Refill    acetaminophen (TYLENOL) 500 MG tablet Take 2 tablets by mouth every 6 hours as needed      amLODIPine (NORVASC) 5 MG tablet Take 1 tablet by mouth nightly      atorvastatin (LIPITOR) 20 MG tablet Take 1 tablet by mouth at bedtime      glipiZIDE (GLUCOTROL) 10 MG tablet Take 1 tablet by mouth in the morning and at bedtime      levothyroxine (SYNTHROID) 200 MCG tablet Take 1 tablet by mouth daily      loratadine (CLARITIN) 10 MG tablet Take 1 tablet by mouth daily      losartan (COZAAR) 50 MG tablet Take 1 tablet by mouth at bedtime      metFORMIN (GLUCOPHAGE) 1000 MG tablet Take 1 tablet by mouth in the morning and at bedtime      Potassium Citrate ER (UROCIT-K) 15 MEQ (1620 MG) TBCR extended release tablet Take 1 tablet by mouth in the morning and at bedtime      APPLE CIDER VINEGAR PO Take 1 tablet by mouth daily       No current facility-administered medications for this visit.     Physical Exam     Vitals:    05/01/22 1019   BP: (!) 156/78   Pulse: 76   Resp: 18   Temp: 98.6 F (37 C)    SpO2: 98%   Weight: (!) 144.2 kg (318 lb)   Height: 1.6 m (5' 2.99")      General: No acute distress, obest  HEENT:Normocephalic, EOMI, PERRL, external ears and nose without abnormality  Respiratory: Bilateral lung fields clear to auscultation. No accessory muscle use.  Cardiovascular: Regular rate and rhythm. No murmurs.  Lymph nodes: No cervical adenopathy.  Skin: No rash. No petechiae or purpura. No cutaneous nodules.   Musculoskeletal: No bony tenderness. No deformities.   Neurological: No sensory or motor deficit. AAO x 3.   Psychiatric: Abrupt    LABS AND IMAGING:     Lab Results   Component Value Date/Time    WBC 8.1 04/17/2022 02:27 PM    HGB 16.8 04/17/2022 02:27 PM    HCT 49.6 04/17/2022 02:27 PM    PLT 217 04/17/2022 02:27 PM    MCV 96 04/17/2022 02:27 PM       Lab Results   Component Value Date/Time    NA 132.8 04/17/2022 02:27 PM    K 4.09 04/17/2022 02:27 PM    CL 102.6 04/17/2022 02:27 PM    CO2 23.1 04/17/2022 02:27 PM    BUN 19.1 04/17/2022 02:27 PM    ALT 21 04/17/2022 02:27 PM       No image results found.     ASSESSMENT AND PLAN:   Prostate cancer: PSMA PET ordered by Dr. Glennon Mac and completed on 04/29/22.  Although it shows no local regional recurrence to the prostate, there is intense activity within multiple shotty small lymph nodes, infrarenal periaortic distribution through  the right common iliac distribution, also with likely involvement of the left inguinal chain.  There is separate distant involvement at the left thoracic inlet. There is no bony metastatic disease. The results are outlined above.  I provided a copy of the results to patient and his daughter.  I discussed possible treatment regimen as reviewed with Dr. Trey Sailors which would include leuprolide and Xtandi.  Patient abrupt and says that he does not want to receive treatment. He has an appointment with Dr. Glennon Mac on Monday, 05/05/22.  I offered a follow-up office visit, next week, so we can coordinate treatment with Dr. Glennon Mac.   Patient declined a follow-up visit and said that he will call our office.     Cystic pancreatic mass: EUS completed on 07/02/2021 with fine-needle aspiration of pancreatic cyst.  Results benign.       No follow-ups on file.  No orders of the defined types were placed in this encounter.    No orders of the defined types were placed in this encounter.            Sharmon Leyden, APRN - NP   [facility]  [place]  [phone]

## 2022-08-08 LAB — COMPREHENSIVE METABOLIC PANEL
ALT: 25 U/L (ref 0–50)
AST: 15 U/L (ref 0–50)
Albumin/Globulin Ratio: 1.7 (ref 1.00–2.70)
Albumin: 4.3 g/dL (ref 3.5–5.2)
Alk Phosphatase: 131 U/L — ABNORMAL HIGH (ref 40–130)
Anion Gap: 12 mmol/L (ref 2–17)
BUN: 19 mg/dL (ref 8–23)
CO2: 24 mmol/L (ref 22–29)
Calcium: 9.6 mg/dL (ref 8.8–10.2)
Chloride: 101 mmol/L (ref 98–107)
Creatinine: 1.1 mg/dL (ref 0.7–1.3)
Est, Glom Filt Rate: 75 mL/min/1.73m (ref >=60)
Globulin: 2.5 g/dL (ref 1.9–4.4)
Glucose: 325 mg/dL — ABNORMAL HIGH (ref 70–99)
OSMOLALITY CALCULATED: 288 mOsm/kg — ABNORMAL HIGH (ref 270–287)
Potassium: 5.2 mmol/L (ref 3.5–5.3)
Sodium: 137 mmol/L (ref 135–145)
Total Bilirubin: 0.36 mg/dL (ref 0.00–1.20)
Total Protein: 6.8 g/dL (ref 6.4–8.3)

## 2022-08-08 LAB — CBC WITH AUTO DIFFERENTIAL
Absolute Baso #: 0.1 10*3/uL (ref 0.0–0.2)
Absolute Eos #: 0.2 10*3/uL (ref 0.0–0.5)
Absolute Lymph #: 1 10*3/uL (ref 1.0–3.2)
Absolute Mono #: 0.6 10*3/uL (ref 0.3–1.0)
Basophils %: 1.3 % (ref 0.0–2.0)
Eosinophils %: 2.7 % (ref 0.0–7.0)
Hematocrit: 52.3 % — ABNORMAL HIGH (ref 38.0–52.0)
Hemoglobin: 17.7 g/dL — ABNORMAL HIGH (ref 13.0–17.3)
Immature Grans (Abs): 0.04 10*3/uL (ref 0.00–0.06)
Immature Granulocytes: 0.5 % (ref 0.0–0.6)
Lymphocytes: 13.3 % — ABNORMAL LOW (ref 15.0–45.0)
MCH: 33 pg (ref 27.0–34.5)
MCHC: 33.8 g/dL (ref 32.0–36.0)
MCV: 97.6 fL (ref 84.0–100.0)
MPV: 11.5 fL (ref 7.2–13.2)
Monocytes: 7.4 % (ref 4.0–12.0)
NRBC Absolute: 0 10*3/uL (ref 0.000–0.012)
NRBC Automated: 0 % (ref 0.0–0.2)
Neutrophils %: 74.8 % — ABNORMAL HIGH (ref 42.0–74.0)
Neutrophils Absolute: 5.6 10*3/uL (ref 1.6–7.3)
Platelets: 219 10*3/uL (ref 140–440)
RBC: 5.36 x10e6/mcL (ref 4.00–5.60)
RDW: 14.8 % (ref 11.0–16.0)
WBC: 7.5 10*3/uL (ref 3.8–10.6)

## 2022-08-08 LAB — PSA, DIAGNOSTIC: PSA: 6.17 ng/mL — ABNORMAL HIGH (ref 0.000–4.000)

## 2022-08-08 LAB — HEMOGLOBIN A1C
Est. Avg. Glucose, WB: 315
Est. Avg. Glucose-calculated: 371
Hemoglobin A1C: 12.6 % — ABNORMAL HIGH (ref 4.0–6.0)

## 2022-08-08 LAB — T3, FREE: T3, Free: 2.03 pg/mL (ref 2.00–4.40)

## 2022-08-08 LAB — TSH: TSH, 3RD GENERATION: 5.68 mcIU/mL — ABNORMAL HIGH (ref 0.358–3.740)

## 2022-08-08 LAB — T4, FREE: T4 Free: 1.52 ng/dL (ref 0.82–1.70)

## 2022-08-08 LAB — MICROALBUMIN / CREATININE URINE RATIO
Creatinine, Ur: 77.6 mg/dL (ref 39.0–259.0)
Microalb, Ur: 10.9 mg/dL (ref 0.0–20.0)
Microalbumin Creatinine Ratio: 140 mg/g Cr — ABNORMAL HIGH (ref 0–30)

## 2022-08-08 LAB — LIPID PANEL
Chol/HDL Ratio: 4.7 — ABNORMAL HIGH (ref 0.0–4.4)
Cholesterol: 150 mg/dL (ref 100–200)
HDL: 32 mg/dL — ABNORMAL LOW (ref >=40)
LDL Cholesterol: 66.2 mg/dL (ref 0.0–100.0)
LDL/HDL Ratio: 2.1
Triglycerides: 259 mg/dL — ABNORMAL HIGH (ref 0–149)
VLDL: 51.8 mg/dL — ABNORMAL HIGH (ref 5.0–40.0)

## 2022-08-29 LAB — T3, FREE: T3, Free: 2.07 pg/mL (ref 2.00–4.40)

## 2022-08-29 LAB — T4, FREE: T4 Free: 1.69 ng/dL (ref 0.82–1.70)

## 2022-08-29 LAB — TSH: TSH, 3RD GENERATION: 4.27 mcIU/mL — ABNORMAL HIGH (ref 0.358–3.740)

## 2022-09-02 NOTE — Progress Notes (Signed)
Pre Procedure Patient Instructions     Procedure Location hospital:[facility]: [address] [place] off at Outpatient Services to the left of the main hospital entrance and proceed to the gold elevator on your left (past the security desk) to the 2nd floor Surgery Reception desk.   Procedure Date: 09/04/22  Arrival Time: 1:30 PM    Your doctor determines your scheduled start time.  Depending on the facility where your procedure is taking place and the scheduled start time, you may be required to arrive up to 2 hours prior.  This is to allow time for registration, your preop assessment, any day of procedure testing and to meet with your care teams members.  This also allows your procedure to be performed earlier should there be a cancellation that day.  We appreciate your patience as we work to provide you with excellent service.    Medications:  Medication to be taken the morning of surgery with a few sips of water only: Levothyroxine.  Hold or reduce the dosage of the following medication, as discussed: Hold Metformin and Glipizide.  Hold Trulicity (Dulaglutide) 7 days prior to your procedure.    Stop all supplements, vitamins and herbal remedies one week prior to your procedure, unless your doctor told you to continue taking.  Do not take over the counter pain medications except plain Tylenol or Acetaminophen unless your doctor told you to do so.  If you are taking blood thinners, call the doctor performing your procedure and prescribing physician for instructions on when to stop.      Procedure Preparation    Diet Restrictions:Your procedure will be cancelled if you do not follow these instructions. Clear liquids ONLY from 12:00am to 11:59pm the day before your procedure (water, black coffee with no milk or cream, juice without pulp, plain non red or purple jello, soda, Ensure Clear, non-red or purple Gatorade, chicken broth, beef broth and bone broth without noodles)  and then NO food or drink including gum or mints after 12:00am the day of your procedure even if your doctor told you to drink fluids.       When taking Trulicity your stomach doesn't empty as quickly and can cause you to inhale your stomach contents while under anesthesia.  Your anesthesia provider will discuss these risks with you further on the day of your procedure.  Even when following the fasting and medication instructions you may have symptoms such as nausea, vomiting, and/or bloating on the day of your procedure.  Depending on your symptoms, the anesthesiologists and surgeon may decide that delaying or cancelling surgery may be the safest decision for you.    If you have questions on the day of your procedure call [facility] at [phone].    Skin Preparation:  Wash with Hibiclens or an antibacterial soap (e.g. Dial soap) the night before and morning of procedure., Using a clean washcloth, wash from neck to toes for 3 minutes. Gently clean the area where your surgery will be done thoroughly., and Do not use Hibiclens on or near your face, eyes, ears or head.    Do not put on any deodorants, lotions, powders, or oils on the day of procedure.  Be sure to put on clean clothes.    Other Preparation:  Call your doctor right away if you have any fever, cold or flu symptoms      Day of Procedure Patient Instruction:  Do not smoke,  vape, chew tobacco, drink alcohol or use recreational drugs on the day of your procedure  Remove all jewelry, piercings, and metal accessories  Do not wear artificial nails and only clear nail polish on natural nails. Nails must be trimmed to fingertip length to make sure the oxygen probe fits properly and to avoid injury  Do not wear artificial eye lashes because they can cause dry eyes, eye scratches, eye infections and allergic reactions  Do not use hair extensions with metal clips or hairstyles near the back of your neck, as it can make it difficult to  safely manage your breathing during anesthesia  If your hair is tightly braided or styled in a complex way, it may need to be undone for your safety  Do not wear contacts, tampons, make-up, lotions, creams, powders, fragrances or deodorant  Do not bring valuables or money  Bring a copy of your Living Will and/or Medical Durable Power of Attorney if you have one  Bring a list of current medications including name and dosage  Bring a picture ID and insurance card     Bring your CPAP or BiPAP machine          If you are going home the same day as your procedure, a support person should accompany you to the facility and must transport you home.  If you plan to take public transportation of any sort, your support person must accompany you home.  You will need someone to stay with you for 24 hours after your procedure with sedation of any kind.     The information and visitor policy was reviewed with you during your Pre-Admission Testing interview and you verbalized understanding. If you have any additional questions please contact [phone]    To pre-register for your procedure please call [phone] Option 1    For financial questions regarding your procedure at a [facility] facility, please contact [phone]    For financial questions regarding anesthesia at a [facility] facility, please  contact [phone]    For MyChart Patient Portal help please call [phone]

## 2022-09-04 ENCOUNTER — Ambulatory Visit: Admit: 2022-09-04 | Payer: BLUE CROSS/BLUE SHIELD | Primary: Family

## 2022-09-04 ENCOUNTER — Inpatient Hospital Stay: Payer: BLUE CROSS/BLUE SHIELD | Attending: Urology

## 2022-09-04 LAB — POCT GLUCOSE
POC Glucose: 130 mg/dL — ABNORMAL HIGH (ref 65.0–110.0)
POC Glucose: 174 mg/dL — ABNORMAL HIGH (ref 65.0–110.0)

## 2022-09-04 MED ORDER — LIDOCAINE HCL (PF) 2 % IJ SOLN
2 % | INTRAMUSCULAR | Status: DC | PRN
  Administered 2022-09-04: 22:00:00 100 via INTRAVENOUS

## 2022-09-04 MED ORDER — DEXAMETHASONE SODIUM PHOSPHATE 4 MG/ML IJ SOLN
4 MG/ML | INTRAMUSCULAR | Status: DC | PRN
  Administered 2022-09-04: 22:00:00 8 via INTRAVENOUS

## 2022-09-04 MED ORDER — ONDANSETRON HCL 4 MG/2ML IJ SOLN
4 | Freq: Once | INTRAMUSCULAR | Status: DC | PRN
Start: 2022-09-04 — End: 2022-09-04

## 2022-09-04 MED ORDER — NORMAL SALINE FLUSH 0.9 % IV SOLN
0.9 | Freq: Two times a day (BID) | INTRAVENOUS | Status: DC
Start: 2022-09-04 — End: 2022-09-04

## 2022-09-04 MED ORDER — PROPOFOL 200 MG/20ML IV EMUL
200 | INTRAVENOUS | Status: AC
Start: 2022-09-04 — End: ?

## 2022-09-04 MED ORDER — SODIUM CHLORIDE 0.9 % IV SOLN
0.9 | INTRAVENOUS | Status: DC | PRN
Start: 2022-09-04 — End: 2022-09-04

## 2022-09-04 MED ORDER — PROPOFOL 200 MG/20ML IV EMUL
200 MG/20ML | INTRAVENOUS | Status: DC | PRN
  Administered 2022-09-04: 23:00:00 100 via INTRAVENOUS
  Administered 2022-09-04 (×2): 50 via INTRAVENOUS
  Administered 2022-09-04: 23:00:00 100 via INTRAVENOUS
  Administered 2022-09-04: 22:00:00 200 via INTRAVENOUS

## 2022-09-04 MED ORDER — OXYCODONE-ACETAMINOPHEN 5-325 MG PO TABS
5-325 | ORAL_TABLET | Freq: Four times a day (QID) | ORAL | 0 refills | Status: AC | PRN
Start: 2022-09-04 — End: 2022-09-07

## 2022-09-04 MED ORDER — SUCCINYLCHOLINE CHLORIDE 20 MG/ML IJ SOLN
20 MG/ML | INTRAMUSCULAR | Status: DC | PRN
  Administered 2022-09-04: 22:00:00 200 via INTRAVENOUS

## 2022-09-04 MED ORDER — NORMAL SALINE FLUSH 0.9 % IV SOLN
0.9 | INTRAVENOUS | Status: DC | PRN
Start: 2022-09-04 — End: 2022-09-04

## 2022-09-04 MED ORDER — ONDANSETRON HCL 4 MG/2ML IJ SOLN
4 | INTRAMUSCULAR | Status: AC
Start: 2022-09-04 — End: ?

## 2022-09-04 MED ORDER — SUGAMMADEX SODIUM 200 MG/2ML IV SOLN
200 MG/2ML | INTRAVENOUS | Status: DC | PRN
  Administered 2022-09-04: 23:00:00 200 via INTRAVENOUS

## 2022-09-04 MED ORDER — LIDOCAINE HCL (PF) 1 % IJ SOLN
1 | INTRAMUSCULAR | Status: AC
Start: 2022-09-04 — End: 2022-09-04
  Administered 2022-09-04: 19:00:00 0.1 via INTRADERMAL

## 2022-09-04 MED ORDER — IPRATROPIUM-ALBUTEROL 0.5-2.5 (3) MG/3ML IN SOLN
Freq: Once | RESPIRATORY_TRACT | Status: AC | PRN
Start: 2022-09-04 — End: 2022-09-04

## 2022-09-04 MED ORDER — DEXAMETHASONE SODIUM PHOSPHATE 4 MG/ML IJ SOLN
4 | INTRAMUSCULAR | Status: AC
Start: 2022-09-04 — End: ?

## 2022-09-04 MED ORDER — IOVERSOL 68 % IJ SOLN
68 | INTRAMUSCULAR | Status: DC | PRN
Start: 2022-09-04 — End: 2022-09-04
  Administered 2022-09-04: 23:00:00 50

## 2022-09-04 MED ORDER — PHENYLEPHRINE HCL 1 MG/10ML IV SOSY
1 MG/10ML | INTRAVENOUS | Status: DC | PRN
  Administered 2022-09-04 (×2): 100 via INTRAVENOUS

## 2022-09-04 MED ORDER — SUGAMMADEX SODIUM 200 MG/2ML IV SOLN
200 | INTRAVENOUS | Status: AC
Start: 2022-09-04 — End: 2022-09-04

## 2022-09-04 MED ORDER — FENTANYL CITRATE (PF) 100 MCG/2ML IJ SOLN
100 | INTRAMUSCULAR | Status: AC
Start: 2022-09-04 — End: ?

## 2022-09-04 MED ORDER — HALOPERIDOL LACTATE 5 MG/ML IJ SOLN
5 | Freq: Once | INTRAMUSCULAR | Status: DC | PRN
Start: 2022-09-04 — End: 2022-09-04

## 2022-09-04 MED ORDER — SULFAMETHOXAZOLE-TRIMETHOPRIM 800-160 MG PO TABS
800-160 | ORAL_TABLET | Freq: Two times a day (BID) | ORAL | 0 refills | Status: AC
Start: 2022-09-04 — End: 2022-09-07

## 2022-09-04 MED ORDER — CEFAZOLIN 3000 MG IN NS 100 ML IVPB
Status: AC
Start: 2022-09-04 — End: 2022-09-04
  Administered 2022-09-04: 22:00:00 3000 mg via INTRAVENOUS

## 2022-09-04 MED ORDER — CEFAZOLIN 3000 MG IN NS 100 ML IVPB
Status: AC
Start: 2022-09-04 — End: 2022-09-04

## 2022-09-04 MED ORDER — HYDROMORPHONE HCL 1 MG/ML IJ SOLN
1 | INTRAMUSCULAR | Status: DC | PRN
Start: 2022-09-04 — End: 2022-09-04

## 2022-09-04 MED ORDER — SUGAMMADEX SODIUM 200 MG/2ML IV SOLN
200 | INTRAVENOUS | Status: DC
Start: 2022-09-04 — End: 2022-09-04

## 2022-09-04 MED ORDER — PHENYLEPHRINE HCL 1 MG/10ML IV SOSY
1 | INTRAVENOUS | Status: AC
Start: 2022-09-04 — End: ?

## 2022-09-04 MED ORDER — LACTATED RINGERS IV SOLN
INTRAVENOUS | Status: DC | PRN
  Administered 2022-09-04: 22:00:00 via INTRAVENOUS

## 2022-09-04 MED ORDER — SUCCINYLCHOLINE CHLORIDE 20 MG/ML IJ SOLN
20 | INTRAMUSCULAR | Status: AC
Start: 2022-09-04 — End: ?

## 2022-09-04 MED ORDER — FENTANYL CITRATE (PF) 100 MCG/2ML IJ SOLN
100 | INTRAMUSCULAR | Status: DC | PRN
Start: 2022-09-04 — End: 2022-09-04

## 2022-09-04 MED ORDER — ONDANSETRON HCL 4 MG/2ML IJ SOLN
4 MG/2ML | INTRAMUSCULAR | Status: DC | PRN
  Administered 2022-09-04: 22:00:00 4 via INTRAVENOUS

## 2022-09-04 MED ORDER — LIDOCAINE HCL (PF) 2 % IJ SOLN
2 | INTRAMUSCULAR | Status: AC
Start: 2022-09-04 — End: ?

## 2022-09-04 MED ORDER — ROCURONIUM BROMIDE 50 MG/5ML IV SOLN
50 | INTRAVENOUS | Status: AC
Start: 2022-09-04 — End: ?

## 2022-09-04 MED ORDER — ROCURONIUM BROMIDE 50 MG/5ML IV SOLN
50 MG/5ML | INTRAVENOUS | Status: DC | PRN
  Administered 2022-09-04: 22:00:00 25 via INTRAVENOUS
  Administered 2022-09-04: 22:00:00 5 via INTRAVENOUS

## 2022-09-04 MED ORDER — LIDOCAINE HCL (PF) 1 % IJ SOLN
1 | Freq: Once | INTRAMUSCULAR | Status: AC
Start: 2022-09-04 — End: 2022-09-04

## 2022-09-04 MED ORDER — FENTANYL CITRATE (PF) 100 MCG/2ML IJ SOLN
100 MCG/2ML | INTRAMUSCULAR | Status: DC | PRN
  Administered 2022-09-04 (×2): 50 via INTRAVENOUS

## 2022-09-04 MED ORDER — IPRATROPIUM-ALBUTEROL 0.5-2.5 (3) MG/3ML IN SOLN
RESPIRATORY_TRACT | Status: AC
Start: 2022-09-04 — End: 2022-09-04
  Administered 2022-09-04: 1 via RESPIRATORY_TRACT

## 2022-09-04 MED FILL — FENTANYL CITRATE (PF) 100 MCG/2ML IJ SOLN: 100 MCG/2ML | INTRAMUSCULAR | Qty: 2

## 2022-09-04 MED FILL — BRIDION 200 MG/2ML IV SOLN: 200 MG/2ML | INTRAVENOUS | Qty: 2

## 2022-09-04 MED FILL — ANECTINE 20 MG/ML IJ SOLN: 20 MG/ML | INTRAMUSCULAR | Qty: 10

## 2022-09-04 MED FILL — XYLOCAINE-MPF 2 % IJ SOLN: 2 % | INTRAMUSCULAR | Qty: 5

## 2022-09-04 MED FILL — PHENYLEPHRINE HCL (PRESSORS) 1 MG/10ML IV SOSY: 1 MG/0ML | INTRAVENOUS | Qty: 10

## 2022-09-04 MED FILL — CEFAZOLIN 3000 MG IN NS 100 ML IVPB: Qty: 100

## 2022-09-04 MED FILL — ONDANSETRON HCL 4 MG/2ML IJ SOLN: 4 MG/2ML | INTRAMUSCULAR | Qty: 2

## 2022-09-04 MED FILL — XYLOCAINE-MPF 1 % IJ SOLN: 1 % | INTRAMUSCULAR | Qty: 2

## 2022-09-04 MED FILL — ROCURONIUM BROMIDE 50 MG/5ML IV SOLN: 50 MG/5ML | INTRAVENOUS | Qty: 5

## 2022-09-04 MED FILL — DEXAMETHASONE SODIUM PHOSPHATE 4 MG/ML IJ SOLN: 4 MG/ML | INTRAMUSCULAR | Qty: 2

## 2022-09-04 MED FILL — DIPRIVAN 200 MG/20ML IV EMUL: 200 MG/20ML | INTRAVENOUS | Qty: 20

## 2022-09-04 MED FILL — IPRATROPIUM-ALBUTEROL 0.5-2.5 (3) MG/3ML IN SOLN: RESPIRATORY_TRACT | Qty: 3

## 2022-09-04 NOTE — Discharge Summary (Signed)
Urology Discharge Summary      Patient Identification  Brady Castro is a 67 y.o. male.  DOB:  1955/12/10  Admit Date:  09/04/2022    Discharge date:   No discharge date for patient encounter.                                   Disposition: home    Discharge Diagnoses: Left ureteral and bladder stone  Patient Active Problem List   Diagnosis    Pancreatic mass    Kidney stone    History of prostate cancer    Tobacco dependence due to cigarettes    Cystadenocarcinoma of pancreas (HCC)    Abnormal findings on diagnostic imaging of abdomen    Personal history of colonic polyps    Benign neoplasm of pancreas, except islets of Langerhans    Morbid obesity ([place])    Ureteral calculus       Surgery: Cystolithopaxy, left URS and laser litho, SBE    Activity:  activity as tolerated and no driving for today    Condition on discharge: Stable    Follow-up: 2-3 weeks for post op check    Hospital course: Uneventful    Hosie Spangle, MD

## 2022-09-04 NOTE — Discharge Instructions (Addendum)
No heavy lifting greater than 10 pounds.  Drink at least 64 oz of water per day.  May restart blood thinners in a few days after bleeding subsides.

## 2022-09-04 NOTE — H&P (Signed)
Urology Attending Admission Note      Reason for Admission: Left ureteral calculus, hydro, flank pain    History: 67 yo Castro with left UVJ 31m stone and additional stone in both renal systems    Meds: see med rec  Family History, Social History, Review of Systems:  Reviewed and agreed to as per chart    Exam:    Vitals:  BP 127/73   Pulse 71   Temp 97.6 F (36.4 C) (Oral)   Resp 17   Ht 1.702 m (5' 7"$ )   Wt (!) 138.6 kg (305 lb 8.9 oz)   SpO2 96%   BMI Brady.86 kg/m   Temp  Avg: 97.6 F (36.4 C)  Min: 97.6 F (36.4 C)  Max: 97.6 F (36.4 C)  No intake or output data in the 24 hours ending 09/04/22 1607    Physical:   Well developed, well nourished in no acute distress  Mood indicates no abnormalities. Pt doesn't appear depressed  Orientated to time and place  Neck is supple, trachea is midline  Respiratory effort is normal  Cardiovascular show no extremity swelling  Abdomen no masses or hernias are palpated, there is no tenderness. Liver and Spleen appear normal.  Skin show no abnormal lesions  Lymph nodes are not palpated in the inguinal, neck, or axillary area.     Castro GU:  Penis appears normal and circumcised  Urethral meatus is normal in size and location  Scrotum appears normal and both testicles appear normal in size and location  Sphincter has good tone  Anus is inspected. There are no perineal masses  Prostate is deferred, no tenderness and no induration  Seminal Vesicles are not palpable      Labs:  WBC:    Lab Results   Component Value Date/Time    WBC 7.5 08/08/2022 08:23 AM     Hemoglobin/Hematocrit:    Lab Results   Component Value Date/Time    HGB 17.7 08/08/2022 08:23 AM    HCT 52.3 08/08/2022 08:23 AM     BMP:    Lab Results   Component Value Date/Time    NA 137 08/08/2022 08:23 AM    K 5.2 08/08/2022 08:23 AM    CL 101 08/08/2022 08:23 AM    CO2 24 08/08/2022 08:23 AM    BUN 19 08/08/2022 08:23 AM    LABALBU 4.3 08/08/2022 08:23 AM    LABALBU 4.20 04/17/2022 02:27 PM    CREATININE 1.1  08/08/2022 08:23 AM    CALCIUM 9.6 08/08/2022 08:23 AM    LABGLOM 75 08/08/2022 08:23 AM     PT/INR:  No results found for: "PROTIME", "INR"  PTT:  No results found for: "APTT"[APTT    Urinalysis:     Imaging:  CT scan reviewed    Impression/Plan: Plan for cysto and left URS, laser litho, SBE, possible stent, may treat stone in kidney if urine not infected appearing, but will likely address stone in kidney at later date since it is not obstructing, pt understands risks of bleeding, infection, anesthetic complications, need for subsequent procedure    MHosie Spangle MD

## 2022-09-04 NOTE — Anesthesia Pre-Procedure Evaluation (Signed)
Department of Anesthesiology  Preprocedure Note       Name:  Brady Castro   Age:  67 y.o.  DOB:  1956/05/24                                          MRN:  [id number]         Date:  09/04/2022      Surgeon: Juliann Mule):  Glennon Mac, Mathis Dad, MD    Procedure: Procedure(s):  CYSTOSCOPY URETEROSCOPY LASER LITHOTRIPSY  CYSTOSCOPY URETERAL STENT INSERTION    Medications prior to admission:   Prior to Admission medications    Medication Sig Start Date End Date Taking? Authorizing Provider   Dulaglutide (TRULICITY SC) Inject 1 each into the skin every 7 days Thursday   Yes [provider]   HYDROcodone-acetaminophen (NORCO) 5-325 MG per tablet Take 1 tablet by mouth every 6 hours as needed for Pain. Max Daily Amount: 4 tablets   Yes [provider]   amLODIPine (NORVASC) 5 MG tablet Take 1 tablet by mouth nightly 08/08/19   [provider]   atorvastatin (LIPITOR) 20 MG tablet Take 1 tablet by mouth at bedtime 08/08/19   [provider]   glipiZIDE (GLUCOTROL) 10 MG tablet Take 1 tablet by mouth in the morning and at bedtime 09/28/19   [provider]   levothyroxine (SYNTHROID) 200 MCG tablet Take 1 tablet by mouth daily 07/29/19   [provider]   losartan (COZAAR) 50 MG tablet Take 1 tablet by mouth at bedtime 08/08/19   [provider]   metFORMIN (GLUCOPHAGE) 1000 MG tablet Take 1 tablet by mouth in the morning and at bedtime    [provider]   Potassium Citrate ER (UROCIT-K) 15 MEQ (1620 MG) TBCR extended release tablet Take 1 tablet by mouth in the morning and at bedtime 05/23/21   [provider]       Current medications:    No current facility-administered medications for this encounter.       Allergies:    Allergies   Allergen Reactions   . Ibuprofen Other (See Comments)     Kidney injury         Problem List:    Patient Active Problem List   Diagnosis Code   . Pancreatic mass K86.89   . Kidney stone N20.0   . History of prostate cancer  Z85.46   . Tobacco dependence due to cigarettes F17.210   . Cystadenocarcinoma of pancreas ([place]) C25.9   . Abnormal findings on diagnostic imaging of abdomen R93.5   . Personal history of colonic polyps Z86.010   . 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 Morbid obesity ([place]) E66.01       Past Medical History:        Diagnosis Date   . Hyperlipidemia    . Hypertension    . Hypothyroidism    . Joint pain    . Kidney stones    . Lower back pain    . Osteoarthritis    . Prostate cancer [facility]) 2017    Urologist: Dr. Leonie Green   . Sleep apnea with use of continuous positive airway pressure (CPAP)    . Type 2 diabetes mellitus ([place])    . Wears glasses        Past Surgical History:  Procedure Laterality Date   . CHOLECYSTECTOMY  2002   . COLONOSCOPY     . COLONOSCOPY N/A 01/09/2022    COLONOSCOPY POLYPECTOMY SNARE/COLD BIOPSY performed by Michail Sermon, MD at [facility] ENDOSCOPY   . HERNIA REPAIR  2015   . KNEE ARTHROSCOPY Right 1997    repeat in 2013   . PROSTATE BIOPSY  2017   . ROTATOR CUFF REPAIR Left 1998   . THYROID SURGERY  2015   . TONSILLECTOMY  2015   . UPPER GASTROINTESTINAL ENDOSCOPY N/A 07/02/2021    ENDOSCOPIC ULTRASOUND performed by Laurence Ferrari, MD at [facility] ENDOSCOPY       Social History:    Social History     Tobacco Use   . Smoking status: Every Day     Current packs/day: 1.00     Average packs/day: 1 pack/day for 46.1 years (46.1 ttl pk-yrs)     Types: Cigarettes     Start date: 89   . Smokeless tobacco: Never   Substance Use Topics   . Alcohol use: Never                                Ready to quit: Not Answered  Counseling given: Not Answered      Vital Signs (Current):   Vitals:    09/02/22 1233   Weight: (!) 140.6 kg (310 lb)   Height: 1.702 m ('5\' 7"'$ )                                              BP Readings from Last 3 Encounters:   05/01/22 (!) 156/78   04/17/22 (!) 153/75   01/09/22 132/78       NPO Status:                                                                                  BMI:   Wt Readings from Last 3 Encounters:   09/02/22 (!) 140.6 kg (310 lb)   05/01/22 (!) 144.2 kg (318 lb)   04/17/22 (!) 145.4 kg (320 lb 8 oz)     Body mass index is 48.55 kg/m.    CBC:   Lab Results   Component Value Date/Time    WBC 7.5 08/08/2022 08:23 AM    RBC 5.36 08/08/2022 08:23 AM    HGB 17.7 08/08/2022 08:23 AM    HCT 52.3 08/08/2022 08:23 AM    MCV 97.6 08/08/2022 08:23 AM    RDW 14.8 08/08/2022 08:23 AM    PLT 219 08/08/2022 08:23 AM       CMP:   Lab Results   Component Value Date/Time    NA 137 08/08/2022 08:23 AM    K 5.2 08/08/2022 08:23 AM    CL 101 08/08/2022 08:23 AM    CO2 24 08/08/2022 08:23 AM    BUN 19 08/08/2022 08:23 AM    CREATININE 1.1 08/08/2022 08:23 AM    AGRATIO 1.70 08/08/2022 08:23 AM    LABGLOM 75  08/08/2022 08:23 AM    GLUCOSE 325 08/08/2022 08:23 AM    PROT 6.8 08/08/2022 08:23 AM    CALCIUM 9.6 08/08/2022 08:23 AM    BILITOT 0.36 08/08/2022 08:23 AM    ALKPHOS 131 08/08/2022 08:23 AM    AST 15 08/08/2022 08:23 AM    ALT 25 08/08/2022 08:23 AM       POC Tests: No results for input(s): "POCGLU", "POCNA", "POCK", "POCCL", "POCBUN", "POCHEMO", "POCHCT" in the last 72 hours.    Coags: No results found for: "PROTIME", "INR", "APTT"    HCG (If Applicable): No results found for: "PREGTESTUR", "PREGSERUM", "HCG", "HCGQUANT"     ABGs: No results found for: "PHART", "PO2ART", "PCO2ART", "HCO3ART", "BEART", "O2SATART"     Type & Screen (If Applicable):  No results found for: "LABABO", "LABRH"    Drug/Infectious Status (If Applicable):  No results found for: "HIV", "HEPCAB"    COVID-19 Screening (If Applicable): No results found for: "COVID19"        Anesthesia Evaluation  Patient summary reviewed and Nursing notes reviewed  Airway: Mallampati: II  TM distance: >3 FB        Dental: normal exam         Pulmonary:normal exam    (+) sleep apnea: on CPAP,  current smoker                           Cardiovascular:    (+) hypertension:,         Rhythm: regular  Rate: normal                     Neuro/Psych:               GI/Hepatic/Renal:   (+) renal disease: kidney stones, morbid obesity          Endo/Other:    (+) DiabetesType II DM, , hypothyroidism::., malignancy/cancer (prostrate cancer).                 Abdominal: normal exam            Vascular:          Other Findings:           Anesthesia Plan      general     ASA 3     (GA  ETT BMI 48.55  glidescope consider crna choice)  Induction: intravenous.      Anesthetic plan and risks discussed with patient.      Plan discussed with CRNA.    Attending anesthesiologist reviewed and agrees with Preprocedure content                Jenel Lucks, MD   09/04/2022

## 2022-09-04 NOTE — Op Note (Signed)
[facility]                        [place] [address]                            OPERATIVE REPORT      PATIENT NAME:Brady Castro, Brady Castro                 DOB:25-Feb-1956  MED REC [id number]                       ROOM:ORP  ACCOUNT [id number]                       ADMIT DATE:09/04/2022  PROVIDER:Nithin Demeo Leonie Green, MD    DATE OF SERVICE:  09/04/2022    PREOPERATIVE DIAGNOSIS:      POSTOPERATIVE DIAGNOSIS:      PROCEDURE PERFORMED:      SURGEON:  Hosie Spangle, MD    PROCEDURE:      PREOPERATIVE DIAGNOSES:  Bladder stone, left ureteral stone, hydronephrosis, flank pain,  rule out renal calculi.    POSTOPERATIVE DIAGNOSES:  Bladder stone, left ureteral stone, hydronephrosis, flank pain,  rule out renal calculi.    OPERATION PERFORMED:  Cystolitholapaxy greater than 2.5 cm as well as a left ureteroscopy, laser lithotripsy, stone basket extraction.    SURGEON:  Hosie Spangle, MD    INDICATION FOR PROCEDURE:  The patient is a 67 year old gentleman, who has CT scan findings of an obstructing large left-sided distal ureteral calculus with some additional stone present within the kidney, smaller stone present within the right kidney.  All possible management options have been discussed with him extensively.  It is unlikely that he is going to pass the stone.  So, we talked about the possibility of retrograde manipulation with ureteroscopy, laser lithotripsy versus antegrade manipulation versus extracorporeal shockwave lithotripsy.  After weighing over carefully all risks and benefits associated with each possible procedure, he elected to undergo retrograde manipulation under my direction.  He understood the risks involved, including possibility of bleeding, infection, anesthetic complications, possibility of adverse pathology noted, possibility of need for subsequent procedures.  He understands these risks and wished to  proceed.    DESCRIPTION OF OPERATION:  On the above date, the patient was brought to the main cystoscopy suite at [facility] where he was placed under LMA anesthesia.  It is important to note that prior to induction of anesthesia, sequential compression devices were placed on the lower extremities and activated. Prophylactic antibiotics were given.  Once under appropriate anesthesia, he was placed in a comfortable low lithotomy position.  All contact points were adequately padded.  No extremity was bent greater than 90 degrees.  He then underwent a typical urology sterile drape and prep about the lower abdomen, penile region, scrotal region, and upper thighs.  Sterile drapes were used to isolate the operative field.  I initiated the procedure by placing a 22-French cystoscope with 30-degree lens through the urethra.  The urethra was normal in appearance.  The scope was advanced through the external urinary sphincter, verumontanum, into the prostate that showed an extremely large median lobe.  The scope was advanced up and over the  median lobe and the lumen of the bladder.  Full visualization of the bladder was performed after emptying the anterior, posterior, left, and right lateral aspects of the bladder.  No mucosal lesions seen.  Moderate trabeculations seen.  No diverticula.  No evidence of any malignancy.  Both ureteral orifices were seen along the trigone.  Emanating from the left ureteral orifice was extremely large stone that during cystoscopic evaluation extruded into the bladder.  Therefore, I used a 5-French open-ended Pollack catheter and a 200 micron laser fiber on settings of a bladder stone which was 1 and 12 to fragment the stone down to extremely small pieces.  I was then able to irrigate out all of the pieces through the scope sheath and this removed the bladder stone.  These pieces were sent for sonographic analysis.  I then accessed the left ureteral orifice with a wire as an  open-ended catheter and performed a retrograde pyelogram.  Retrograde pyelogram showed dilation of the ureter down to the level of the ureterovesical junction, but also showed a stone within the distal ureter.  I therefore left the wire intact tended as a safety wire and used a semirigid ureteroscope to negotiate into the left ureter and identified the stone using 200 micron laser fiber on ureteral stone settings to break down the stone.  The pieces were then basketed out with a 1.73F ZeroTip nitinol basket and dropped into the bladder. These were later extracted through the cystoscope sheath, but retrograde did not show any additional stones.  Retrograde showed no evidence of any further obstruction and was clear.  This ureter was draining copiously, so therefore it was so dilated that I did not feel that a stent was necessary.  There was no evidence of any trauma to the ureter either.  Therefore, I drained the bladder.  I completely drained all the remaining stone fragments through the cystoscope, removed the cystoscope.  The patient was then awakened from LMA anesthesia and transported to the postop care unit in stable condition.    FINDINGS:  As above.    COMPLICATIONS:  None.    SPECIMEN:  Include bladder and ureteral stone sent for analysis.    BLOOD LOSS:  1 mL.    No other issues.    DISPOSITION:  Will be discharged as an outpatient.        Hosie Spangle, MD      MSW/AQS  D:  09/05/2022 09:14:47  T:  09/05/2022 11:31:49  JOB #:  [id number]

## 2022-09-05 NOTE — Anesthesia Post-Procedure Evaluation (Signed)
Department of Anesthesiology  Postprocedure Note    Patient: Brady Castro  MRN: [id number]  Birthdate: 05/15/1956  Date of evaluation: 09/05/2022    Procedure Summary     Date: 09/04/22 Room / Location: [facility] OR 04 / [facility] MAIN OR    Anesthesia Start: 1655 Anesthesia Stop: 1829    Procedure: CYSTOSCOPY LITHOLAPAXY, URETEROSCOPY LASER LITHOTRIPSY (Left) Diagnosis:       Kidney stones      (Kidney stones [N20.0])    Surgeons: Hosie Spangle, MD Responsible Provider: Estevan Ryder, MD    Anesthesia Type: General ASA Status: 3          Anesthesia Type: General    Aldrete Phase I: Aldrete Score: 10    Aldrete Phase II: Aldrete Score: 10    Anesthesia Post Evaluation    Patient location during evaluation: PACU  Level of consciousness: awake and alert  Airway patency: patent  Nausea & Vomiting: no nausea and no vomiting  Cardiovascular status: blood pressure returned to baseline  Respiratory status: acceptable  Hydration status: stable  Comments: Vitals reviewed  Pain management: adequate        No notable events documented.

## 2022-09-15 LAB — STONE ANALYSIS
Stone Weight: 85 mg
Uric Acid, Stone: 100 %

## 2022-11-28 LAB — LIPID PANEL
Chol/HDL Ratio: 4 (ref 0.0–4.4)
Cholesterol, Total: 119 mg/dL (ref 100–200)
HDL: 30 mg/dL — ABNORMAL LOW (ref >=40)
LDL Cholesterol: 50.2 mg/dL (ref 0.0–100.0)
LDL/HDL Ratio: 1.7
Triglycerides: 194 mg/dL — ABNORMAL HIGH (ref 0–149)
VLDL: 38.8 mg/dL (ref 5.0–40.0)

## 2022-11-28 LAB — TSH: TSH, 3rd Generation: 0.323 mcIU/mL — ABNORMAL LOW (ref 0.358–3.740)

## 2022-11-28 LAB — HEMOGLOBIN A1C
Estimated Avg Glucose: 246
Estimated Avg Glucose: 286
Hemoglobin A1C: 10.2 % — ABNORMAL HIGH (ref 4.0–6.0)

## 2022-11-28 LAB — PSA SCREENING: PSA, Screening: 12.5 ng/mL — ABNORMAL HIGH (ref 0.000–4.000)

## 2022-11-28 LAB — T4, FREE: T4 Free: 2.36 ng/dL — ABNORMAL HIGH (ref 0.82–1.70)

## 2022-11-28 LAB — T3, FREE: T3, Free: 2.25 pg/mL (ref 2.00–4.40)

## 2023-09-15 LAB — CBC WITH AUTO DIFFERENTIAL
Basophils %: 1 % (ref 0.0–2.0)
Basophils Absolute: 0.1 10*3/uL (ref 0.0–0.2)
Eosinophils %: 1.9 % (ref 0.0–7.0)
Eosinophils Absolute: 0.2 10*3/uL (ref 0.0–0.5)
Hematocrit: 50.1 % (ref 38.0–52.0)
Hemoglobin: 16.8 g/dL (ref 13.0–17.3)
Immature Grans (Abs): 0.05 10*3/uL (ref 0.00–0.06)
Immature Granulocytes %: 0.6 % (ref 0.0–0.6)
Lymphocytes Absolute: 1.4 10*3/uL (ref 1.0–3.2)
Lymphocytes: 15.4 % (ref 15.0–45.0)
MCH: 31.9 pg (ref 27.0–34.5)
MCHC: 33.5 g/dL (ref 30.0–36.0)
MCV: 95.1 fL (ref 84.0–100.0)
MPV: 11.1 fL (ref 7.0–12.2)
Monocytes %: 9 % (ref 4.0–12.0)
Monocytes Absolute: 0.8 10*3/uL (ref 0.3–1.0)
NRBC Absolute: 0 10*3/uL (ref 0.000–0.012)
NRBC Automated: 0 % (ref 0.0–0.2)
Neutrophils %: 72.1 % (ref 42.0–74.0)
Neutrophils Absolute: 6.3 10*3/uL (ref 1.6–7.3)
Platelets: 241 10*3/uL (ref 140–440)
RBC: 5.27 x10e6/mcL (ref 4.00–5.60)
RDW: 14.6 % (ref 10.0–17.0)
WBC: 8.8 10*3/uL (ref 3.8–10.6)

## 2023-09-15 LAB — COMPREHENSIVE METABOLIC PANEL
ALT: 24 U/L (ref 0–42)
AST: 15 U/L (ref 0–46)
Albumin/Globulin Ratio: 1.3 (ref 1.00–2.70)
Albumin: 3.9 g/dL (ref 3.5–5.2)
Alk Phosphatase: 117 U/L (ref 40–130)
Anion Gap: 12 mmol/L (ref 2–17)
BUN: 15 mg/dL (ref 8–23)
CO2: 26 mmol/L (ref 22–29)
Calcium: 9.5 mg/dL (ref 8.5–10.7)
Chloride: 102 mmol/L (ref 98–107)
Creatinine: 1 mg/dL (ref 0.7–1.3)
Est, Glom Filt Rate: 84 mL/min/1.73mÂ² (ref >=60)
Globulin: 3.1 g/dL (ref 1.9–4.4)
Glucose: 289 mg/dL — ABNORMAL HIGH (ref 70–99)
Osmolaliy Calculated: 291 mosm/kg — ABNORMAL HIGH (ref 270–287)
Potassium: 4.5 mmol/L (ref 3.5–5.3)
Sodium: 140 mmol/L (ref 135–145)
Total Bilirubin: 0.26 mg/dL (ref 0.00–1.20)
Total Protein: 7 g/dL (ref 5.7–8.3)

## 2023-09-15 LAB — HEMOGLOBIN A1C
Estimated Avg Glucose: 255
Estimated Avg Glucose: 296
Hemoglobin A1C: 10.5 % — ABNORMAL HIGH (ref 4.0–6.0)

## 2023-09-15 LAB — TSH: TSH, 3rd Generation: 0.662 u[IU]/mL (ref 0.358–3.740)

## 2023-09-15 LAB — PSA, DIAGNOSTIC: PSA: 75.1 ng/mL — ABNORMAL HIGH (ref 0.000–4.000)

## 2023-09-15 LAB — T4, FREE: T4 Free: 1.92 ng/dL — ABNORMAL HIGH (ref 0.82–1.70)

## 2023-09-15 LAB — T3, FREE: T3, Free: 2.43 pg/mL (ref 2.00–4.40)
